# Patient Record
Sex: Male | Born: 1990 | Race: Black or African American | Hispanic: No | Marital: Married | Smoking: Never smoker
Health system: Southern US, Community
[De-identification: ages and names within clinical notes are randomized; demographics above are authoritative.]

## PROBLEM LIST (undated history)

## (undated) DIAGNOSIS — I10 Essential (primary) hypertension: Secondary | ICD-10-CM

## (undated) HISTORY — PX: TONSILLECTOMY: SUR1361

---
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
## 2012-08-03 DIAGNOSIS — M25561 Pain in right knee: Secondary | ICD-10-CM

## 2012-08-03 HISTORY — DX: Pain in right knee: M25.561

## 2012-08-04 ENCOUNTER — Other Ambulatory Visit: Payer: Self-pay | Admitting: Orthopaedic Surgery

## 2012-08-04 DIAGNOSIS — M25569 Pain in unspecified knee: Secondary | ICD-10-CM

## 2012-08-07 ENCOUNTER — Other Ambulatory Visit: Payer: Medicaid Other

## 2016-03-26 ENCOUNTER — Emergency Department (HOSPITAL_COMMUNITY): Payer: Self-pay

## 2016-03-26 ENCOUNTER — Emergency Department (HOSPITAL_COMMUNITY)
Admission: EM | Admit: 2016-03-26 | Discharge: 2016-03-26 | Disposition: A | Discharge status: Home / Self Care | Payer: Self-pay | Attending: Emergency Medicine | Admitting: Emergency Medicine

## 2016-03-26 ENCOUNTER — Encounter (HOSPITAL_COMMUNITY): Payer: Self-pay | Admitting: *Deleted

## 2016-03-26 DIAGNOSIS — S93401A Sprain of unspecified ligament of right ankle, initial encounter: Secondary | ICD-10-CM | POA: Insufficient documentation

## 2016-03-26 DIAGNOSIS — R030 Elevated blood-pressure reading, without diagnosis of hypertension: Secondary | ICD-10-CM

## 2016-03-26 DIAGNOSIS — Y999 Unspecified external cause status: Secondary | ICD-10-CM | POA: Insufficient documentation

## 2016-03-26 DIAGNOSIS — I10 Essential (primary) hypertension: Secondary | ICD-10-CM | POA: Insufficient documentation

## 2016-03-26 DIAGNOSIS — Y9301 Activity, walking, marching and hiking: Secondary | ICD-10-CM | POA: Insufficient documentation

## 2016-03-26 DIAGNOSIS — Y929 Unspecified place or not applicable: Secondary | ICD-10-CM | POA: Insufficient documentation

## 2016-03-26 DIAGNOSIS — W1840XA Slipping, tripping and stumbling without falling, unspecified, initial encounter: Secondary | ICD-10-CM | POA: Insufficient documentation

## 2016-03-26 DIAGNOSIS — IMO0001 Reserved for inherently not codable concepts without codable children: Secondary | ICD-10-CM

## 2016-03-26 MED ORDER — HYDROCODONE-ACETAMINOPHEN 5-325 MG PO TABS: 1.0000 | ORAL_TABLET | Freq: Four times a day (QID) | ORAL | Status: DC | PRN

## 2016-03-26 NOTE — Progress Notes (Signed)
Orthopedic Tech Progress Note Patient Details:  Maryclare LabradorDwayne Mayr 02-23-1991 [id number]  Ortho Devices Type of Ortho Device: Ankle Air splint, Crutches Ortho Device/Splint Location: RLE Ortho Device/Splint Interventions: Ordered, Application   Jennye MoccasinHughes, Temperence Zenor Craig 03/26/2016, 5:58 PM

## 2016-03-26 NOTE — ED Notes (Signed)
Pt c/o right ankle pain approximately 15 minutes prior to ED arrival. Pt states he was helping his mom then suddenly rolled his right ankle. Pt able to walk and bear weight on ankle.

## 2016-03-26 NOTE — ED Provider Notes (Signed)
CSN: [id number]     Arrival date & time 03/26/16  1512 History   By signing my name below, I, Freida Busman, attest that this documentation has been prepared under the direction and in the presence of non-physician practitioner, Kerrie Buffalo, NP. Electronically Signed: Freida Busman, Scribe. 03/26/2016. 5:18 PM.    Chief Complaint  Patient presents with  . Ankle Pain    Patient is a 25 y.o. male presenting with ankle pain. The history is provided by the patient. No language interpreter was used.  Ankle Pain Location:  Ankle Injury: yes   Ankle location:  R ankle Pain details:    Radiates to:  Does not radiate   Severity:  Moderate   Timing:  Constant Chronicity:  Recurrent Dislocation: no   Foreign body present:  No foreign bodies Associated symptoms: swelling   Associated symptoms: no back pain and no numbness   Risk factors: no known bone disorder      HPI Comments:  Zachary Sharp is a 25 y.o. male who presents to the Emergency Department complaining of 9/10 pain and moderate swelling of the right ankle s/p injury PTA. Pt states he tripped while walking and believes he rolled his foot. He denies head injury and LOC. No alleviating factors noted. Pt notes initial injury 2-3 months ago when he "badly" rolled the same ankle. Pt states he went to a physical therapist following that injury and notes he's intermitttently experienced occasional pain since but has been able to ambulate without difficulty.    Past Medical History  Diagnosis Date  . Hypertension    Past Surgical History  Procedure Laterality Date  . Tonsillectomy     History reviewed. No pertinent family history. Social History  Substance Use Topics  . Smoking status: Never Smoker   . Smokeless tobacco: Never Used  . Alcohol Use: No    Review of Systems  Musculoskeletal: Positive for myalgias and arthralgias. Negative for back pain.       Right ankle  Neurological: Negative for weakness.  All other systems  reviewed and are negative.   Allergies  Review of patient's allergies indicates no known allergies.  Home Medications   Prior to Admission medications   Medication Sig Start Date End Date Taking? Authorizing Provider  amLODipine (NORVASC) 10 MG tablet Take 10 mg by mouth daily.      Historical Provider, MD  HYDROcodone-acetaminophen (NORCO) 5-325 MG tablet Take 1 tablet by mouth every 6 (six) hours as needed. 03/26/16   Hope Orlene Och, NP  montelukast (SINGULAIR) 10 MG tablet Take 10 mg by mouth daily as needed. Seasonal  allergies     Historical Provider, MD   BP 141/112 mmHg  Pulse 98  Temp(Src) 97.9 F (36.6 C) (Oral)  Resp 18  Ht  (1.778 m)  Wt 122.471 kg  BMI 38.74 kg/m2  SpO2 98% Physical Exam  Constitutional: He is oriented to person, place, and time. He appears well-developed and well-nourished. No distress.  HENT:  Head: Normocephalic and atraumatic.  Eyes: Conjunctivae are normal.  Cardiovascular: Normal rate.   Pulses:      Dorsalis pedis pulses are 2+ on the right side.  2+ DP pulse with adequate circulation  Pulmonary/Chest: Effort normal.  Abdominal: He exhibits no distension.  Musculoskeletal:  Swelling noted to lateral aspect of right ankle; no pain over medial aspect  Limited ROM due to pain Plantar and dorsiflexion intact Achilles tendon intact  Neurological: He is alert and oriented to person, place,  and time.  Skin: Skin is warm and dry.  Psychiatric: He has a normal mood and affect.  Nursing note and vitals reviewed.   ED Course  Procedures   DIAGNOSTIC STUDIES:  Oxygen Saturation is 98% on RA, normal by my interpretation.    COORDINATION OF CARE:  5:10 PM Discussed treatment plan with pt at bedside and pt agreed to plan.  Labs Review Labs Reviewed - No data to display  Imaging Review Dg Ankle Complete Right  03/26/2016  CLINICAL DATA:  Ankle pain with swelling and instability after falling today. Initial encounter. EXAM: RIGHT  ANKLE - COMPLETE 3+ VIEW COMPARISON:  None. FINDINGS: The mineralization and alignment are normal. There is no evidence of acute fracture or dislocation. The tibiotalar joint spaces are maintained. There is moderate lateral soft tissue swelling. IMPRESSION: Lateral soft tissue swelling.  No evidence of acute osseous injury. Electronically Signed   By: Carey BullocksWilliam  Veazey M.D.   On: 03/26/2016 16:28    MDM   Final diagnoses:  Ankle sprain, right, initial encounter  Elevated BP     Patient X-Ray negative for obvious fracture or dislocation.  Pt advised to follow up with orthopedics. Patient given splint and crutches while in ED, conservative therapy recommended and discussed. Patient will be discharged home with Norco & is agreeable with above plan. Returns precautions discussed. Pt appears safe for discharge.  I personally performed the services described in this documentation, which was scribed in my presence. The recorded information has been reviewed and is accurate.   [address]Hope Tehach[place]M Neese, NP 03/26/16 1756  Bethann BerkshireJoseph Zammit, MD 03/26/16 [phone]

## 2016-03-26 NOTE — Discharge Instructions (Signed)
Take the motrin you have at home as needed. Do not drive if taking the narcotic as it will make you sleepy.  Ankle Sprain An ankle sprain is an injury to the strong, fibrous tissues (ligaments) that hold the bones of your ankle joint together.  CAUSES An ankle sprain is usually caused by a fall or by twisting your ankle. Ankle sprains most commonly occur when you step on the outer edge of your foot, and your ankle turns inward. People who participate in sports are more prone to these types of injuries.  SYMPTOMS   Pain in your ankle. The pain may be present at rest or only when you are trying to stand or walk.  Swelling.  Bruising. Bruising may develop immediately or within 1 to 2 days after your injury.  Difficulty standing or walking, particularly when turning corners or changing directions. DIAGNOSIS  Your caregiver will ask you details about your injury and perform a physical exam of your ankle to determine if you have an ankle sprain. During the physical exam, your caregiver will press on and apply pressure to specific areas of your foot and ankle. Your caregiver will try to move your ankle in certain ways. An X-ray exam may be done to be sure a bone was not broken or a ligament did not separate from one of the bones in your ankle (avulsion fracture).  TREATMENT  Certain types of braces can help stabilize your ankle. Your caregiver can make a recommendation for this. Your caregiver may recommend the use of medicine for pain. If your sprain is severe, your caregiver may refer you to a surgeon who helps to restore function to parts of your skeletal system (orthopedist) or a physical therapist. HOME CARE INSTRUCTIONS   Apply ice to your injury for 1-2 days or as directed by your caregiver. Applying ice helps to reduce inflammation and pain.  Put ice in a plastic bag.  Place a towel between your skin and the bag.  Leave the ice on for 15-20 minutes at a time, every 2 hours while you are  awake.  Only take over-the-counter or prescription medicines for pain, discomfort, or fever as directed by your caregiver.  Elevate your injured ankle above the level of your heart as much as possible for 2-3 days.  If your caregiver recommends crutches, use them as instructed. Gradually put weight on the affected ankle. Continue to use crutches or a cane until you can walk without feeling pain in your ankle.  If you have a plaster splint, wear the splint as directed by your caregiver. Do not rest it on anything harder than a pillow for the first 24 hours. Do not put weight on it. Do not get it wet. You may take it off to take a shower or bath.  You may have been given an elastic bandage to wear around your ankle to provide support. If the elastic bandage is too tight (you have numbness or tingling in your foot or your foot becomes cold and blue), adjust the bandage to make it comfortable.  If you have an air splint, you may blow more air into it or let air out to make it more comfortable. You may take your splint off at night and before taking a shower or bath. Wiggle your toes in the splint several times per day to decrease swelling. SEEK MEDICAL CARE IF:   You have rapidly increasing bruising or swelling.  Your toes feel extremely cold or you lose feeling  in your foot.  Your pain is not relieved with medicine. SEEK IMMEDIATE MEDICAL CARE IF:  Your toes are numb or blue.  You have severe pain that is increasing. MAKE SURE YOU:   Understand these instructions.  Will watch your condition.  Will get help right away if you are not doing well or get worse.   This information is not intended to replace advice given to you by your health care provider. Make sure you discuss any questions you have with your health care provider.   Document Released: 08/25/2005 Document Revised: 09/15/2014 Document Reviewed: 09/06/2011 Elsevier Interactive Patient Education [employer]2016 Elsevier Inc.

## 2016-03-26 NOTE — ED Notes (Signed)
Ortho at bedside.

## 2016-03-26 NOTE — ED Notes (Signed)
Patient able to ambulate independently  

## 2017-08-21 DIAGNOSIS — I10 Essential (primary) hypertension: Secondary | ICD-10-CM | POA: Diagnosis not present

## 2019-10-05 DIAGNOSIS — Z20828 Contact with and (suspected) exposure to other viral communicable diseases: Secondary | ICD-10-CM | POA: Diagnosis not present

## 2019-10-11 DIAGNOSIS — U071 COVID-19: Secondary | ICD-10-CM | POA: Diagnosis not present

## 2020-07-01 ENCOUNTER — Other Ambulatory Visit: Payer: Self-pay

## 2020-07-01 ENCOUNTER — Ambulatory Visit
Admission: RE | Admit: 2020-07-01 | Discharge: 2020-07-01 | Disposition: A | Discharge status: Home / Self Care | Payer: Medicaid Other | Source: Ambulatory Visit | Attending: Emergency Medicine | Admitting: Emergency Medicine

## 2020-07-01 VITALS — BP 178/113 | HR 107 | Temp 98.4°F | Resp 18

## 2020-07-01 DIAGNOSIS — J069 Acute upper respiratory infection, unspecified: Secondary | ICD-10-CM

## 2020-07-01 DIAGNOSIS — Z20822 Contact with and (suspected) exposure to covid-19: Secondary | ICD-10-CM

## 2020-07-01 MED ORDER — AMLODIPINE BESYLATE 10 MG PO TABS: 10.0000 mg | ORAL_TABLET | Freq: Every day | ORAL | 1 refills | Status: DC

## 2020-07-01 MED ORDER — FLUTICASONE PROPIONATE 50 MCG/ACT NA SUSP: 1.0000 | Freq: Every day | NASAL | 0 refills | Status: DC

## 2020-07-01 MED ORDER — BENZONATATE 100 MG PO CAPS: 100.0000 mg | ORAL_CAPSULE | Freq: Three times a day (TID) | ORAL | 0 refills | Status: DC

## 2020-07-01 MED ORDER — MONTELUKAST SODIUM 10 MG PO TABS: 10.0000 mg | ORAL_TABLET | Freq: Every day | ORAL | 0 refills | Status: DC | PRN

## 2020-07-01 NOTE — Discharge Instructions (Addendum)

## 2020-07-01 NOTE — ED Triage Notes (Signed)
Pt sts cough and nasal congestion x 10 days; denies fever

## 2020-07-01 NOTE — ED Provider Notes (Signed)
[facility]    CSN: [id number] Arrival date & time: 07/01/20  5462      History   Chief Complaint Chief Complaint  Patient presents with  . Appointment    1000  . Cough    HPI Zachary Sharp is a 29 y.o. male  Present for Covid testing.  Endorsing 10-day course of dry cough, nasal congestion.  No fever, chest pain or palpitations, shortness of breath or wheezing.  Does admit to asthma childhood: Does not have inhaler at home.  States he has not felt like he needed one.  States today he is doing better than he has previously.  Does have history of Covid in January 2021: Recovered without sequela.  Of note, patient's blood pressure is elevated: Has not taken amlodipine "in a while ".  Does not have PCP.  Denies headache, visual or auditory changes, dizziness, slurred speech, weakness, chest pain.  Past Medical History:  Diagnosis Date  . Hypertension     There are no problems to display for this patient.   Past Surgical History:  Procedure Laterality Date  . TONSILLECTOMY         Home Medications    Prior to Admission medications   Medication Sig Start Date End Date Taking? Authorizing Provider  amLODipine (NORVASC) 10 MG tablet Take 1 tablet (10 mg total) by mouth daily. 07/01/20   Hall-Potvin, [place], PA-C  benzonatate (TESSALON) 100 MG capsule Take 1 capsule (100 mg total) by mouth every 8 (eight) hours. 07/01/20   Hall-Potvin, [place], PA-C  fluticasone (FLONASE) 50 MCG/ACT nasal spray Place 1 spray into both nostrils daily. 07/01/20   Hall-Potvin, [place], PA-C  montelukast (SINGULAIR) 10 MG tablet Take 1 tablet (10 mg total) by mouth daily as needed. Seasonal  allergies 07/01/20   Hall-Potvin, [place], PA-C    Family History Family History  Family history unknown: Yes    Social History Social History   Tobacco Use  . Smoking status: Never Smoker  . Smokeless tobacco: Never Used  Substance Use Topics  . Alcohol use: No  . Drug use:  No     Allergies   Patient has no known allergies.   Review of Systems As per HPI   Physical Exam Triage Vital Signs ED Triage Vitals  Enc Vitals Group     BP      Pulse      Resp      Temp      Temp src      SpO2      Weight      Height      Head Circumference      Peak Flow      Pain Score      Pain Loc      Pain Edu?      Excl. in GC?    No data found.  Updated Vital Signs BP (!) 178/113 (BP Location: Right Arm)   Pulse (!) 107   Temp 98.4 F (36.9 C) (Oral)   Resp 18   SpO2 97%   Visual Acuity Right Eye Distance:   Left Eye Distance:   Bilateral Distance:    Right Eye Near:   Left Eye Near:    Bilateral Near:     Physical Exam Constitutional:      General: He is not in acute distress.    Appearance: He is not toxic-appearing or diaphoretic.  HENT:     Head: Normocephalic and atraumatic.     Mouth/Throat:  Mouth: Mucous membranes are moist.     Pharynx: Oropharynx is clear.  Eyes:     General: No scleral icterus.    Conjunctiva/sclera: Conjunctivae normal.     Pupils: Pupils are equal, round, and reactive to light.  Neck:     Comments: Trachea midline, negative JVD Cardiovascular:     Rate and Rhythm: Regular rhythm. Tachycardia present.  Pulmonary:     Effort: Pulmonary effort is normal. No respiratory distress.     Breath sounds: No wheezing.  Musculoskeletal:     Cervical back: Neck supple. No tenderness.  Lymphadenopathy:     Cervical: No cervical adenopathy.  Skin:    Capillary Refill: Capillary refill takes less than 2 seconds.     Coloration: Skin is not jaundiced or pale.     Findings: No rash.  Neurological:     Mental Status: He is alert and oriented to person, place, and time.      UC Treatments / Results  Labs (all labs ordered are listed, but only abnormal results are displayed) Labs Reviewed  NOVEL CORONAVIRUS, NAA    EKG   Radiology No results found.  Procedures Procedures (including critical care  time)  Medications Ordered in UC Medications - No data to display  Initial Impression / Assessment and Plan / UC Course  I have reviewed the triage vital signs and the nursing notes.  Pertinent labs & imaging results that were available during my care of the patient were reviewed by me and considered in my medical decision making (see chart for details).     Patient afebrile, nontoxic, with SpO2 97%.  Covid PCR pending.  Patient to quarantine until results are back.  We will treat supportively as outlined below.  Will resume amlodipine, which he has tolerated well in the past, today and follow-up with PCP for further eval/mgmt PRN.  Return precautions discussed, patient verbalized understanding and is agreeable to plan. Final Clinical Impressions(s) / UC Diagnoses   Final diagnoses:  Encounter for screening laboratory testing for COVID-19 virus  URI with cough and congestion     Discharge Instructions     Tessalon for cough. Start flonase, atrovent nasal spray for nasal congestion/drainage. You can use over the counter nasal saline rinse such as neti pot for nasal congestion. Keep hydrated, your urine should be clear to pale yellow in color. Tylenol/motrin for fever and pain. Monitor for any worsening of symptoms, chest pain, shortness of breath, wheezing, swelling of the throat, go to the emergency department for further evaluation needed.     ED Prescriptions    Medication Sig Dispense Auth. Provider   montelukast (SINGULAIR) 10 MG tablet Take 1 tablet (10 mg total) by mouth daily as needed. Seasonal  allergies 30 tablet Hall-Potvin, [place], PA-C   amLODipine (NORVASC) 10 MG tablet Take 1 tablet (10 mg total) by mouth daily. 30 tablet Hall-Potvin, [place], PA-C   benzonatate (TESSALON) 100 MG capsule Take 1 capsule (100 mg total) by mouth every 8 (eight) hours. 21 capsule Hall-Potvin, [place], PA-C   fluticasone (FLONASE) 50 MCG/ACT nasal spray Place 1 spray into both nostrils  daily. 16 g Hall-Potvin, [place], PA-C     PDMP not reviewed this encounter.   Hall-Potvin, [place], [place] 07/01/20 1036

## 2020-07-02 LAB — NOVEL CORONAVIRUS, NAA: SARS-CoV-2, NAA: NOT DETECTED

## 2020-07-02 LAB — SARS-COV-2, NAA 2 DAY TAT

## 2020-08-11 ENCOUNTER — Other Ambulatory Visit: Payer: Self-pay

## 2020-08-11 ENCOUNTER — Ambulatory Visit
Admission: RE | Admit: 2020-08-11 | Discharge: 2020-08-11 | Disposition: A | Discharge status: Home / Self Care | Payer: 59 | Source: Ambulatory Visit | Attending: Urgent Care | Admitting: Urgent Care

## 2020-08-11 VITALS — HR 103 | Temp 98.4°F | Resp 20

## 2020-08-11 DIAGNOSIS — M549 Dorsalgia, unspecified: Secondary | ICD-10-CM | POA: Diagnosis not present

## 2020-08-11 DIAGNOSIS — S46912A Strain of unspecified muscle, fascia and tendon at shoulder and upper arm level, left arm, initial encounter: Secondary | ICD-10-CM

## 2020-08-11 MED ORDER — TIZANIDINE HCL 4 MG PO TABS: 4.0000 mg | ORAL_TABLET | Freq: Every day | ORAL | 0 refills | Status: DC

## 2020-08-11 MED ORDER — NAPROXEN 500 MG PO TABS: 500.0000 mg | ORAL_TABLET | Freq: Two times a day (BID) | ORAL | 0 refills | Status: DC

## 2020-08-11 NOTE — ED Triage Notes (Signed)
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 Pt states he took and has been taking otc meds but they ae starting to not help with the pain/discomfort. Pt is aox4 and ambulatory.

## 2020-08-11 NOTE — ED Provider Notes (Signed)
[facility]   MRN: [id number] DOB: [birth year]  Subjective:   Zachary Sharp is a 29 y.o. male presenting for 65-month history of persistent left upper back pain over the scapular area.  Patient states that symptoms started after he slipped getting out of his rig/truck, grabbed onto an overhead handle and caught his body from falling.  He felt a really bad pull in the area and since has been hurting over the left upper back overlying the scapula.  He actually injured it again shortly thereafter.  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  Has not used any medications for relief.  Regarding his blood pressure, states that he forgot to take his amlodipine today.  No current facility-administered medications for this encounter.  Current Outpatient Medications:  .  amLODipine (NORVASC) 10 MG tablet, Take 1 tablet (10 mg total) by mouth daily., Disp: 30 tablet, Rfl: 1 .  benzonatate (TESSALON) 100 MG capsule, Take 1 capsule (100 mg total) by mouth every 8 (eight) hours., Disp: 21 capsule, Rfl: 0 .  fluticasone (FLONASE) 50 MCG/ACT nasal spray, Place 1 spray into both nostrils daily., Disp: 16 g, Rfl: 0 .  montelukast (SINGULAIR) 10 MG tablet, Take 1 tablet (10 mg total) by mouth daily as needed. Seasonal  allergies, Disp: 30 tablet, Rfl: 0   No Known Allergies  Past Medical History:  Diagnosis Date  . Hypertension      Past Surgical History:  Procedure Laterality Date  . TONSILLECTOMY      Family History  Family history unknown: Yes    Social History   Tobacco Use  . Smoking status: Never Smoker  . Smokeless tobacco: Never Used  Vaping Use  . Vaping Use: Never used  Substance Use Topics  . Alcohol use: No  . Drug use: No    ROS   Objective:   Vitals: Pulse (!) 103   Temp 98.4 F (36.9 C) (Oral)   Resp 20   SpO2 97%   BP 145/107 by PA-Marranda Arakelian.   Physical Exam Constitutional:      General:  He is not in acute distress.    Appearance: Normal appearance. He is well-developed. He is obese. He is not ill-appearing, toxic-appearing or diaphoretic.  HENT:     Head: Normocephalic and atraumatic.     Right Ear: External ear normal.     Left Ear: External ear normal.     Nose: Nose normal.     Mouth/Throat:     Pharynx: Oropharynx is clear.  Eyes:     General: No scleral icterus.       Right eye: No discharge.        Left eye: No discharge.     Extraocular Movements: Extraocular movements intact.     Pupils: Pupils are equal, round, and reactive to light.  Cardiovascular:     Rate and Rhythm: Normal rate.  Pulmonary:     Effort: Pulmonary effort is normal.  Musculoskeletal:     Left shoulder: No swelling, deformity, effusion, laceration, tenderness or bony tenderness. Normal range of motion. Normal strength.     Cervical back: Normal range of motion.     Thoracic back: Spasms and tenderness present. No swelling, edema, deformity, signs of trauma, lacerations or bony tenderness. Normal range of motion. No scoliosis.       Back:  Neurological:     Mental Status: He is alert and oriented to person,  place, and time.  Psychiatric:        Mood and Affect: Mood normal.        Behavior: Behavior normal.        Thought Content: Thought content normal.        Judgment: Judgment normal.      Assessment and Plan :   PDMP not reviewed this encounter.  1. Upper back pain on left side   2. Muscle strain of left scapular region, initial encounter     Counseled patient on general management of muscle strain of the left scapular region.  Recommended actually starting oral medications for this including naproxen, tizanidine.  If symptoms persist, recommended follow-up with [facility] as he is seen them before.  Also provided him with information to a different orthopedic group. Counseled patient on potential for adverse effects with medications prescribed/recommended  today, ER and return-to-clinic precautions discussed, patient verbalized understanding.    Wallis Bamberg, PA-C 08/11/20 1247

## 2020-09-26 ENCOUNTER — Ambulatory Visit
Admission: RE | Admit: 2020-09-26 | Discharge: 2020-09-26 | Disposition: A | Discharge status: Home / Self Care | Payer: 59 | Source: Ambulatory Visit | Attending: Emergency Medicine | Admitting: Emergency Medicine

## 2020-09-26 ENCOUNTER — Other Ambulatory Visit: Payer: Self-pay

## 2020-09-26 VITALS — BP 163/119 | HR 107 | Temp 98.2°F | Resp 18

## 2020-09-26 DIAGNOSIS — M7661 Achilles tendinitis, right leg: Secondary | ICD-10-CM

## 2020-09-26 MED ORDER — PREDNISONE 10 MG PO TABS: ORAL_TABLET | ORAL | 0 refills | Status: DC

## 2020-09-26 MED ORDER — NAPROXEN 500 MG PO TABS: 500.0000 mg | ORAL_TABLET | Freq: Two times a day (BID) | ORAL | 0 refills | Status: DC

## 2020-09-26 NOTE — ED Triage Notes (Signed)
Pt sts right ankle pain x 2 weeks; sts hx of injury to same ankle many years ago but denies new injury

## 2020-09-26 NOTE — Discharge Instructions (Signed)
Begin prednisone course over the next 6 days-take with food After completing prednisone may use Naprosyn twice daily as needed for pain May wear ankle brace for support and comfort Ice and elevate when resting Gentle stretching-see attached Follow-up if not improving or worsening

## 2020-09-26 NOTE — ED Provider Notes (Signed)
[facility]    CSN: [id number] Arrival date & time: 09/26/20  1557      History   Chief Complaint Chief Complaint  Patient presents with  . Appointment    1600  . Ankle Pain    HPI Zachary [facility] is a 30 y.o. male history of hypertension presenting today for evaluation of right ankle pain.  Reports over the past 2 to 3 weeks he has had pain in the back portion of his ankle, believes it in his Achilles.  Denies any injury or trauma recently.  Denies any increase in activity.  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  HPI  Past Medical History:  Diagnosis Date  . Hypertension     There are no problems to display for this patient.   Past Surgical History:  Procedure Laterality Date  . TONSILLECTOMY         Home Medications    Prior to Admission medications   Medication Sig Start Date End Date Taking? Authorizing Provider  naproxen (NAPROSYN) 500 MG tablet Take 1 tablet (500 mg total) by mouth 2 (two) times daily. 09/26/20  Yes James Lafalce C, PA-C  predniSONE (DELTASONE) 10 MG tablet Begin with 6 tabs on day 1, 5 tab on day 2, 4 tab on day 3, 3 tab on day 4, 2 tab on day 5, 1 tab on day 6-take with food 09/26/20  Yes Jessabelle Markiewicz C, PA-C  amLODipine (NORVASC) 10 MG tablet Take 1 tablet (10 mg total) by mouth daily. 07/01/20   Hall-Potvin, [place], PA-C  fluticasone (FLONASE) 50 MCG/ACT nasal spray Place 1 spray into both nostrils daily. 07/01/20   Hall-Potvin, [place], PA-C  montelukast (SINGULAIR) 10 MG tablet Take 1 tablet (10 mg total) by mouth daily as needed. Seasonal  allergies 07/01/20   Hall-Potvin, [place], PA-C    Family History Family History  Family history unknown: Yes    Social History Social History   Tobacco Use  . Smoking status: Never Smoker  . Smokeless tobacco: Never Used  Vaping Use  . Vaping Use: Never used  Substance Use Topics   . Alcohol use: No  . Drug use: No     Allergies   Patient has no known allergies.   Review of Systems Review of Systems  Constitutional: Negative for fatigue and fever.  Eyes: Negative for redness, itching and visual disturbance.  Respiratory: Negative for shortness of breath.   Cardiovascular: Negative for chest pain and leg swelling.  Gastrointestinal: Negative for nausea and vomiting.  Musculoskeletal: Positive for arthralgias and gait problem. Negative for myalgias.  Skin: Negative for color change, rash and wound.  Neurological: Negative for dizziness, syncope, weakness, light-headedness and headaches.     Physical Exam Triage Vital Signs ED Triage Vitals  Enc Vitals Group     BP 09/26/20 1607 (!) 160/121     Pulse Rate 09/26/20 1607 (!) 107     Resp 09/26/20 1607 18     Temp 09/26/20 1607 98.2 F (36.8 C)     Temp Source 09/26/20 1607 Oral     SpO2 09/26/20 1607 96 %     Weight --      Height --      Head Circumference --      Peak Flow --      Pain Score 09/26/20 1625 4     Pain Loc --  Pain Edu? --      Excl. in GC? --    No data found.  Updated Vital Signs BP (!) 163/119   Pulse (!) 107   Temp 98.2 F (36.8 C) (Oral)   Resp 18   SpO2 96%   Visual Acuity Right Eye Distance:   Left Eye Distance:   Bilateral Distance:    Right Eye Near:   Left Eye Near:    Bilateral Near:     Physical Exam Vitals and nursing note reviewed.  Constitutional:      Appearance: He is well-developed and well-nourished.     Comments: No acute distress  HENT:     Head: Normocephalic and atraumatic.     Nose: Nose normal.  Eyes:     Conjunctiva/sclera: Conjunctivae normal.  Cardiovascular:     Rate and Rhythm: Normal rate.  Pulmonary:     Effort: Pulmonary effort is normal. No respiratory distress.  Abdominal:     General: There is no distension.  Musculoskeletal:        General: Normal range of motion.     Cervical back: Neck supple.     Comments:  Right foot/ankle: No obvious swelling deformity or discoloration, nontender to palpation of medial lateral malleolus or throughout dorsum of foot, tender to palpation along distal Achilles and into heel, no plantar tenderness, dorsalis pedis 2+  Skin:    General: Skin is warm and dry.  Neurological:     Mental Status: He is alert and oriented to person, place, and time.  Psychiatric:        Mood and Affect: Mood and affect normal.      UC Treatments / Results  Labs (all labs ordered are listed, but only abnormal results are displayed) Labs Reviewed - No data to display  EKG   Radiology No results found.  Procedures Procedures (including critical care time)  Medications Ordered in UC Medications - No data to display  Initial Impression / Assessment and Plan / UC Course  I have reviewed the triage vital signs and the nursing notes.  Pertinent labs & imaging results that were available during my care of the patient were reviewed by me and considered in my medical decision making (see chart for details).     Achilles tendinitis-no mechanism of injury, negative Thompson's, initiating on 6-day prednisone taper followed by NSAIDs.  Ice elevation and gentle stretching. Discussed strict return precautions. Patient verbalized understanding and is agreeable with plan.   Final Clinical Impressions(s) / UC Diagnoses   Final diagnoses:  Achilles tendinitis of right lower extremity     Discharge Instructions     Begin prednisone course over the next 6 days-take with food After completing prednisone may use Naprosyn twice daily as needed for pain May wear ankle brace for support and comfort Ice and elevate when resting Gentle stretching-see attached Follow-up if not improving or worsening    ED Prescriptions    Medication Sig Dispense Auth. Provider   predniSONE (DELTASONE) 10 MG tablet Begin with 6 tabs on day 1, 5 tab on day 2, 4 tab on day 3, 3 tab on day 4, 2 tab on day  5, 1 tab on day 6-take with food 21 tablet Keylin Podolsky C, PA-C   naproxen (NAPROSYN) 500 MG tablet Take 1 tablet (500 mg total) by mouth 2 (two) times daily. 30 tablet Aalijah Lanphere, [place] C, PA-C     PDMP not reviewed this encounter.   Lew Dawes, [place] 09/26/20 1751

## 2021-04-02 ENCOUNTER — Other Ambulatory Visit: Payer: Self-pay

## 2021-04-02 ENCOUNTER — Ambulatory Visit
Admission: EM | Admit: 2021-04-02 | Discharge: 2021-04-02 | Disposition: A | Discharge status: Home / Self Care | Payer: 59 | Attending: Student | Admitting: Student

## 2021-04-02 DIAGNOSIS — R112 Nausea with vomiting, unspecified: Secondary | ICD-10-CM

## 2021-04-02 DIAGNOSIS — R197 Diarrhea, unspecified: Secondary | ICD-10-CM

## 2021-04-02 DIAGNOSIS — K219 Gastro-esophageal reflux disease without esophagitis: Secondary | ICD-10-CM

## 2021-04-02 DIAGNOSIS — E861 Hypovolemia: Secondary | ICD-10-CM

## 2021-04-02 MED ORDER — OMEPRAZOLE 20 MG PO CPDR: 20.0000 mg | DELAYED_RELEASE_CAPSULE | Freq: Every day | ORAL | 0 refills | Status: DC

## 2021-04-02 MED ORDER — ONDANSETRON 8 MG PO TBDP: 8.0000 mg | ORAL_TABLET | Freq: Three times a day (TID) | ORAL | 0 refills | Status: DC | PRN

## 2021-04-02 MED ORDER — ALUM & MAG HYDROXIDE-SIMETH 200-200-20 MG/5ML PO SUSP
30.0000 mL | Freq: Once | ORAL | Status: AC
  Administered 2021-04-02: 30 mL via ORAL

## 2021-04-02 NOTE — Discharge Instructions (Addendum)
-  Prilosec (omeprazole) twice daily while symptoms persist -Take the Zofran (ondansetron) up to 3 times daily for nausea and vomiting. Dissolve one pill under your tongue or between your teeth and your cheek. -You can continue pepcid -You're a little dehydrated. Make sure to drink lots of fluids- water, gatorade, etc.  -Seek additional medical attention if symptoms worse/persist (you're not able to keep fluids down despite treatment, you develop worsening abdominal pain, you become weak or dizzy, you start vomiting up blood, etc.)

## 2021-04-02 NOTE — ED Provider Notes (Signed)
[facility]    CSN: [id number] Arrival date & time: 04/02/21  1749      History   Chief Complaint Chief Complaint  Patient presents with   Emesis   Nausea    HPI Zachary [facility] is a 30 y.o. male presenting with GI symptoms. Medical history untreated GERD and hypertension.  Endorses 5 days of nausea with vomiting, watery diarrhea, generalized abdominal cramping and "gurgling", and some epigastric burning.  3 episodes of bilious vomiting and about 3 episodes of watery diarrhea today.  States the epigastric burning is actually minimal, but he is belching acid.  Pepcid is providing little relief.  He is tolerating some bland foods, but has not tolerated more exciting foods like sausage.  States that other family members also had the same symptoms, as they were traveling in [place] and eating rich foods, but his other family members are now feeling better.  Denies weakness, dizziness, left-sided chest pain, shortness of breath, dizziness, fevers/chills, cough. Negative home covid test.     HPI  Past Medical History:  Diagnosis Date   Hypertension     There are no problems to display for this patient.   Past Surgical History:  Procedure Laterality Date   TONSILLECTOMY         Home Medications    Prior to Admission medications   Medication Sig Start Date End Date Taking? Authorizing Provider  omeprazole (PRILOSEC) 20 MG capsule Take 1 capsule (20 mg total) by mouth daily. With breakfast and dinner while symptoms persist 04/02/21  Yes Rhys Martini, PA-C  ondansetron (ZOFRAN ODT) 8 MG disintegrating tablet Take 1 tablet (8 mg total) by mouth every 8 (eight) hours as needed for nausea or vomiting. 04/02/21  Yes Rhys Martini, PA-C  amLODipine (NORVASC) 10 MG tablet Take 1 tablet (10 mg total) by mouth daily. 07/01/20   Hall-Potvin, [place], PA-C  fluticasone (FLONASE) 50 MCG/ACT nasal spray Place 1 spray into both nostrils daily. 07/01/20   Hall-Potvin,  [place], PA-C  montelukast (SINGULAIR) 10 MG tablet Take 1 tablet (10 mg total) by mouth daily as needed. Seasonal  allergies 07/01/20   Hall-Potvin, [place], PA-C  naproxen (NAPROSYN) 500 MG tablet Take 1 tablet (500 mg total) by mouth 2 (two) times daily. 09/26/20   Wieters, Hallie C, PA-C  predniSONE (DELTASONE) 10 MG tablet Begin with 6 tabs on day 1, 5 tab on day 2, 4 tab on day 3, 3 tab on day 4, 2 tab on day 5, 1 tab on day 6-take with food 09/26/20   Wieters, Junius Creamer, PA-C    Family History Family History  Family history unknown: Yes    Social History Social History   Tobacco Use   Smoking status: Never   Smokeless tobacco: Never  Vaping Use   Vaping Use: Never used  Substance Use Topics   Alcohol use: No   Drug use: No     Allergies   Patient has no known allergies.   Review of Systems Review of Systems  Constitutional:  Negative for appetite change, chills, diaphoresis, fever and unexpected weight change.  HENT:  Negative for congestion, ear pain, sinus pressure, sinus pain, sneezing, sore throat and trouble swallowing.   Respiratory:  Negative for cough, chest tightness and shortness of breath.   Cardiovascular:  Negative for chest pain.  Gastrointestinal:  Positive for abdominal pain, nausea and vomiting. Negative for abdominal distention, anal bleeding, blood in stool, constipation, diarrhea and rectal pain.  Genitourinary:  Negative  for dysuria, flank pain, frequency and urgency.  Musculoskeletal:  Negative for back pain and myalgias.  Neurological:  Negative for dizziness, light-headedness and headaches.  All other systems reviewed and are negative.   Physical Exam Triage Vital Signs ED Triage Vitals  Enc Vitals Group     BP      Pulse      Resp      Temp      Temp src      SpO2      Weight      Height      Head Circumference      Peak Flow      Pain Score      Pain Loc      Pain Edu?      Excl. in GC?    No data found.  Updated Vital  Signs BP (!) 160/112 (BP Location: Left Arm)   Pulse (!) 120   Temp 98.6 F (37 C) (Oral)   Resp 18   SpO2 94%   Visual Acuity Right Eye Distance:   Left Eye Distance:   Bilateral Distance:    Right Eye Near:   Left Eye Near:    Bilateral Near:     Physical Exam Vitals reviewed.  Constitutional:      General: He is not in acute distress.    Appearance: Normal appearance. He is obese. He is not ill-appearing.  HENT:     Head: Normocephalic and atraumatic.     Mouth/Throat:     Mouth: Mucous membranes are moist.     Comments: Dry mucous membranes Eyes:     Extraocular Movements: Extraocular movements intact.     Pupils: Pupils are equal, round, and reactive to light.  Cardiovascular:     Rate and Rhythm: Regular rhythm. Tachycardia present.     Heart sounds: Normal heart sounds.  Pulmonary:     Effort: Pulmonary effort is normal.     Breath sounds: Normal breath sounds. No wheezing, rhonchi or rales.  Abdominal:     General: Bowel sounds are normal. There is no distension.     Palpations: Abdomen is soft. There is no mass.     Tenderness: There is no abdominal tenderness. There is no right CVA tenderness, left CVA tenderness, guarding or rebound. Negative signs include Murphy's sign, Rovsing's sign and McBurney's sign.     Comments: Exam limited due to body habitus Increased BS throughout Epigastric tenderness  Skin:    General: Skin is warm.     Capillary Refill: Capillary refill takes 2 to 3 seconds.     Comments: Cap refill 2-3 seconds  Neurological:     General: No focal deficit present.     Mental Status: He is alert and oriented to person, place, and time.  Psychiatric:        Mood and Affect: Mood normal.        Behavior: Behavior normal.     UC Treatments / Results  Labs (all labs ordered are listed, but only abnormal results are displayed) Labs Reviewed - No data to display  EKG   Radiology No results found.  Procedures Procedures (including  critical care time)  Medications Ordered in UC Medications  alum & mag hydroxide-simeth (MAALOX/MYLANTA) 200-200-20 MG/5ML suspension 30 mL (has no administration in time range)    Initial Impression / Assessment and Plan / UC Course  I have reviewed the triage vital signs and the nursing notes.  Pertinent labs & imaging results  that were available during my care of the patient were reviewed by me and considered in my medical decision making (see chart for details).     This patient is a very pleasant 30 y.o. year old male presenting with GERD and nausea with vomiting x5 days. This patient is tachycardic and hypertensive. Dry mucous membranes.   Long history GERD that is typically well controlled on no medications. Suspect exacerbation related to travel/eating out. GI cocktail administered today with some improvement. Continue pepcid; also sent prilosec and zofran. Good hydration. ED return precautions discussed. Patient verbalizes understanding and agreement.   Negative home covid test.   Coding this visit a Level 4 given acute illness with systemic symptoms and prescription drug management.   Final Clinical Impressions(s) / UC Diagnoses   Final diagnoses:  Gastroesophageal reflux disease without esophagitis  Non-intractable vomiting with nausea, unspecified vomiting type  Diarrhea, unspecified type  Hypovolemia     Discharge Instructions      -Prilosec (omeprazole) twice daily while symptoms persist -Take the Zofran (ondansetron) up to 3 times daily for nausea and vomiting. Dissolve one pill under your tongue or between your teeth and your cheek. -You can continue pepcid -You're a little dehydrated. Make sure to drink lots of fluids- water, gatorade, etc.  -Seek additional medical attention if symptoms worse/persist (you're not able to keep fluids down despite treatment, you develop worsening abdominal pain, you become weak or dizzy, you start vomiting up blood,  etc.)     ED Prescriptions     Medication Sig Dispense Auth. Provider   ondansetron (ZOFRAN ODT) 8 MG disintegrating tablet Take 1 tablet (8 mg total) by mouth every 8 (eight) hours as needed for nausea or vomiting. 20 tablet Rhys Martini, PA-C   omeprazole (PRILOSEC) 20 MG capsule Take 1 capsule (20 mg total) by mouth daily. With breakfast and dinner while symptoms persist 30 capsule Rhys Martini, PA-C      PDMP not reviewed this encounter.   Rhys Martini, PA-C 04/02/21 1839

## 2021-04-02 NOTE — ED Triage Notes (Signed)
5 day h/o emesis, nausea diarrhea and two days of abdominal cramping and intermittent GERD.

## 2021-06-17 NOTE — Progress Notes (Signed)
Erroneous encounter

## 2021-06-18 ENCOUNTER — Encounter: Payer: 59 | Admitting: Family

## 2021-06-18 DIAGNOSIS — Z7689 Persons encountering health services in other specified circumstances: Secondary | ICD-10-CM

## 2021-06-19 ENCOUNTER — Ambulatory Visit
Admission: EM | Admit: 2021-06-19 | Discharge: 2021-06-19 | Disposition: A | Discharge status: Home / Self Care | Payer: 59 | Attending: Internal Medicine | Admitting: Internal Medicine

## 2021-06-19 ENCOUNTER — Encounter: Payer: Self-pay | Admitting: Emergency Medicine

## 2021-06-19 ENCOUNTER — Other Ambulatory Visit: Payer: Self-pay

## 2021-06-19 DIAGNOSIS — R030 Elevated blood-pressure reading, without diagnosis of hypertension: Secondary | ICD-10-CM | POA: Diagnosis not present

## 2021-06-19 DIAGNOSIS — H65193 Other acute nonsuppurative otitis media, bilateral: Secondary | ICD-10-CM

## 2021-06-19 MED ORDER — AMOXICILLIN 875 MG PO TABS: 875.0000 mg | ORAL_TABLET | Freq: Two times a day (BID) | ORAL | 0 refills | Status: AC

## 2021-06-19 NOTE — Discharge Instructions (Addendum)
You have been prescribed amoxicillin antibiotic to treat bilateral ear infection.  Please follow-up with primary care for further evaluation and management of blood pressure.

## 2021-06-19 NOTE — ED Triage Notes (Signed)
Patient c/o left ear pain x 3-4 days, lymph nodes on left side swollen as well.  Patient denies any OTC meds.  Patient is vaccinated for COVID.

## 2021-06-19 NOTE — ED Provider Notes (Signed)
[facility]    CSN: [id number] Arrival date & time: 06/19/21  1356      History   Chief Complaint Chief Complaint  Patient presents with   Otalgia    HPI Zachary Sharp is a 30 y.o. male.   Patient presents with left hip pain that has been present for approximately 3 days.  Patient denies any recent or current upper respiratory symptoms, sore throat, fever.  Denies any known sick contacts.  Denies any trauma to the ear.  Denies any drainage from the ear.  Denies any decreased hearing.  Patient also has elevated blood pressure reading in urgent [facility] today.  Patient reports that he takes blood pressure medication and was supposed to have a follow-up with PCP yesterday, but he missed his appointment.  Patient has rescheduled appointment.  Patient denies any chest pain, shortness of breath, headache, blurred vision, dizziness, nausea, vomiting.   Otalgia  Past Medical History:  Diagnosis Date   Hypertension     There are no problems to display for this patient.   Past Surgical History:  Procedure Laterality Date   TONSILLECTOMY         Home Medications    Prior to Admission medications   Medication Sig Start Date End Date Taking? Authorizing Provider  amLODipine (NORVASC) 10 MG tablet Take 1 tablet (10 mg total) by mouth daily. 07/01/20  Yes Hall-Potvin, [place], PA-C  amoxicillin (AMOXIL) 875 MG tablet Take 1 tablet (875 mg total) by mouth 2 (two) times daily for 10 days. 06/19/21 06/29/21 Yes Lance Muss, FNP  fluticasone (FLONASE) 50 MCG/ACT nasal spray Place 1 spray into both nostrils daily. 07/01/20   Hall-Potvin, [place], PA-C  montelukast (SINGULAIR) 10 MG tablet Take 1 tablet (10 mg total) by mouth daily as needed. Seasonal  allergies 07/01/20   Hall-Potvin, [place], PA-C  naproxen (NAPROSYN) 500 MG tablet Take 1 tablet (500 mg total) by mouth 2 (two) times daily. 09/26/20   Wieters, Hallie C, PA-C  omeprazole (PRILOSEC) 20 MG capsule Take 1  capsule (20 mg total) by mouth daily. With breakfast and dinner while symptoms persist 04/02/21   Rhys Martini, PA-C  ondansetron (ZOFRAN ODT) 8 MG disintegrating tablet Take 1 tablet (8 mg total) by mouth every 8 (eight) hours as needed for nausea or vomiting. 04/02/21   Rhys Martini, PA-C  predniSONE (DELTASONE) 10 MG tablet Begin with 6 tabs on day 1, 5 tab on day 2, 4 tab on day 3, 3 tab on day 4, 2 tab on day 5, 1 tab on day 6-take with food 09/26/20   Wieters, Junius Creamer, PA-C    Family History Family History  Family history unknown: Yes    Social History Social History   Tobacco Use   Smoking status: Never   Smokeless tobacco: Never  Vaping Use   Vaping Use: Never used  Substance Use Topics   Alcohol use: No   Drug use: No     Allergies   Patient has no known allergies.   Review of Systems Review of Systems Per HPI  Physical Exam Triage Vital Signs ED Triage Vitals  Enc Vitals Group     BP 06/19/21 1430 (!) 168/122     Pulse Rate 06/19/21 1430 98     Resp 06/19/21 1430 18     Temp 06/19/21 1430 98.7 F (37.1 C)     Temp Source 06/19/21 1430 Oral     SpO2 06/19/21 1430 97 %  Weight 06/19/21 1431 (!) 360 lb (163.3 kg)     Height 06/19/21 1431 5\' 10"  (1.778 m)     Head Circumference --      Peak Flow --      Pain Score 06/19/21 1431 6     Pain Loc --      Pain Edu? --      Excl. in GC? --    No data found.  Updated Vital Signs BP (!) 159/128 (BP Location: Left Arm)   Pulse 98   Temp 98.7 F (37.1 C) (Oral)   Resp 18   Ht 5\' 10"  (1.778 m)   Wt (!) 360 lb (163.3 kg)   SpO2 97%   BMI 51.65 kg/m   Visual Acuity Right Eye Distance:   Left Eye Distance:   Bilateral Distance:    Right Eye Near:   Left Eye Near:    Bilateral Near:     Physical Exam Constitutional:      General: He is not in acute distress.    Appearance: Normal appearance. He is not toxic-appearing or diaphoretic.  HENT:     Head: Normocephalic and atraumatic.     Right  Ear: Ear canal normal. No decreased hearing noted. No drainage, swelling or tenderness. No middle ear effusion. No mastoid tenderness. Tympanic membrane is erythematous. Tympanic membrane is not perforated or bulging.     Left Ear: Ear canal normal. No drainage, swelling or tenderness.  No middle ear effusion. No mastoid tenderness. Tympanic membrane is erythematous. Tympanic membrane is not perforated or bulging.     Nose: Nose normal.     Mouth/Throat:     Lips: Pink.     Mouth: Mucous membranes are moist.     Pharynx: Oropharynx is clear.  Eyes:     Extraocular Movements: Extraocular movements intact.     Conjunctiva/sclera: Conjunctivae normal.  Cardiovascular:     Rate and Rhythm: Normal rate and regular rhythm.     Pulses: Normal pulses.     Heart sounds: Normal heart sounds.  Pulmonary:     Effort: Pulmonary effort is normal. No respiratory distress.     Breath sounds: Normal breath sounds.  Lymphadenopathy:     Cervical: No cervical adenopathy.  Skin:    General: Skin is warm and dry.  Neurological:     General: No focal deficit present.     Mental Status: He is alert and oriented to person, place, and time. Mental status is at baseline.  Psychiatric:        Mood and Affect: Mood normal.        Behavior: Behavior normal.        Thought Content: Thought content normal.        Judgment: Judgment normal.     UC Treatments / Results  Labs (all labs ordered are listed, but only abnormal results are displayed) Labs Reviewed - No data to display  EKG   Radiology No results found.  Procedures Procedures (including critical [facility] time)  Medications Ordered in UC Medications - No data to display  Initial Impression / Assessment and Plan / UC Course  I have reviewed the triage vital signs and the nursing notes.  Pertinent labs & imaging results that were available during my [facility] of the patient were reviewed by me and considered in my medical decision making (see chart  for details).     Will treat bilateral otitis media with amoxicillin x10 days.  Patient advised to follow-up with PCP  for further evaluation and management of blood pressure and to monitor blood pressure at home.  No signs of hypertensive urgency on physical exam at this time.  No red flags seen on exam.Discussed strict return precautions. Patient verbalized understanding and is agreeable with plan.  Final Clinical Impressions(s) / UC Diagnoses   Final diagnoses:  Other non-recurrent acute nonsuppurative otitis media of both ears  Elevated blood pressure reading     Discharge Instructions      You have been prescribed amoxicillin antibiotic to treat bilateral ear infection.  Please follow-up with primary [facility] for further evaluation and management of blood pressure.     ED Prescriptions     Medication Sig Dispense Auth. Provider   amoxicillin (AMOXIL) 875 MG tablet Take 1 tablet (875 mg total) by mouth 2 (two) times daily for 10 days. 20 tablet Lance Muss, FNP      PDMP not reviewed this encounter.   Lance Muss, FNP 06/19/21 1454

## 2021-07-31 NOTE — Progress Notes (Signed)
Subjective:    Zachary Sharp - 30 y.o. male MRN [id number]  Date of birth: [birth year]  HPI  Zachary Sharp is to establish care.   Current issues and/or concerns: HYPERTENSION: Currently taking: see medication list Med Adherence: Reports has not taken Amlodipine for at least 6 months, doesn't feel it is working as it should. Reports taking Amlodipine since in high school without improvement of blood pressure. Was followed by Cardiology in the past and continued on the same Amlodipine. In the past has not had any other blood pressure medications besides Amlodipine.  Adherence with salt restriction (low-salt diet): []  Yes   [x]  No Exercise: Yes []  No [x]  Home Monitoring?: []  Yes    [x]  No Monitoring Frequency: []  Yes    [x]  No Home BP results range: []  Yes    [x]  No Smoking []  Yes [x]  No SOB/Chest Pain []  Yes    [x]  No  2. URGENT CARE FOLLOW-UP: 06/19/2021 [facility] per NP note: Will treat bilateral otitis media with amoxicillin x10 days.  Patient advised to follow-up with PCP for further evaluation and management of blood pressure and to monitor blood pressure at home.  No signs of hypertensive urgency on physical exam at this time.  No red flags seen on exam.Discussed strict return precautions. Patient verbalized understanding and is agreeable with plan.   08/06/2021: Finished Amoxicillin and ears feeling back to normal.    ROS per HPI    Health Maintenance:  Health Maintenance Due  Topic Date Due   COVID-19 Vaccine (1) Never done   Pneumococcal Vaccine 75-60 Years old (1 - PCV) Never done   HIV Screening  Never done   Hepatitis C Screening  Never done   TETANUS/TDAP  10/01/2020     Past Medical History: Patient Active Problem List   Diagnosis Date Noted   Allergic rhinitis 08/06/2021   Asthma 08/06/2021   Essential (primary) hypertension 08/06/2021   History of chlamydia 08/06/2021   Keloid scar 08/06/2021   Morbid obesity (HCC)  08/06/2021   Knee pain, right 08/03/2012     Social History   reports that he has never smoked. He has never used smokeless tobacco. He reports that he does not drink alcohol and does not use drugs.   Family History  Family history is unknown by patient.   Medications: reviewed and updated   Objective:   Physical Exam BP (!) 168/88 (BP Location: Left Arm, Patient Position: Sitting, Cuff Size: Large)   Pulse (!) 111   Temp 98.3 F (36.8 C)   Resp 18   Ht 5' 8.54" (1.741 m)   Wt (!) 385 lb (174.6 kg)   SpO2 97%   BMI 57.62 kg/m   Physical Exam HENT:     Head: Normocephalic and atraumatic.  Eyes:     Extraocular Movements: Extraocular movements intact.     Conjunctiva/sclera: Conjunctivae normal.     Pupils: Pupils are equal, round, and reactive to light.  Cardiovascular:     Rate and Rhythm: Tachycardia present.     Pulses: Normal pulses.     Heart sounds: Normal heart sounds.  Pulmonary:     Effort: Pulmonary effort is normal.     Breath sounds: Normal breath sounds.  Musculoskeletal:     Cervical back: Normal range of motion and neck supple.  Neurological:     General: No focal deficit present.     Mental Status: He is alert and oriented to person, place, and  time.  Psychiatric:        Mood and Affect: Mood normal.        Behavior: Behavior normal.      Assessment & Plan:  1. Encounter to establish care: - Patient presents today to establish care.  - Return for annual physical examination, labs, and health maintenance. Arrive fasting meaning having no food for at least 8 hours prior to appointment. You may have only water or black coffee. Please take scheduled medications as normal.  2. Essential (primary) hypertension: - Blood pressure not at goal during today's visit. Patient asymptomatic without chest pressure, chest pain, palpitations, shortness of breath, worst headache of life, and any additional red flag symptoms. - Begin Valsartan-Hydrochlorothiazide  as prescribed. - Counseled on blood pressure goal of less than 130/80, low-sodium, DASH diet, medication compliance, 150 minutes of moderate intensity exercise per week as tolerated. Discussed medication compliance, adverse effects. - BMP to evaluate kidney function and electrolyte balance. - Follow-up with primary provider in 2 weeks or sooner if needed.  - Basic Metabolic Panel - valsartan-hydrochlorothiazide (DIOVAN-HCT) 160-12.5 MG tablet; Take 1 tablet by mouth daily.  Dispense: 30 tablet; Refill: 0  3. Other non-recurrent acute nonsuppurative otitis media of both ears: - Resolved.     Patient was given clear instructions to go to Emergency Department or return to medical center if symptoms don't improve, worsen, or new problems develop.The patient verbalized understanding.  I discussed the assessment and treatment plan with the patient. The patient was provided an opportunity to ask questions and all were answered. The patient agreed with the plan and demonstrated an understanding of the instructions.   The patient was advised to call back or seek an in-person evaluation if the symptoms worsen or if the condition fails to improve as anticipated.    Ricky Stabs, NP 08/06/2021, 9:37 AM Primary Care at [facility]

## 2021-08-06 ENCOUNTER — Encounter: Payer: Self-pay | Admitting: Family

## 2021-08-06 ENCOUNTER — Other Ambulatory Visit: Payer: Self-pay

## 2021-08-06 ENCOUNTER — Ambulatory Visit (INDEPENDENT_AMBULATORY_CARE_PROVIDER_SITE_OTHER): Payer: 59 | Admitting: Family

## 2021-08-06 VITALS — BP 168/88 | HR 111 | Temp 98.3°F | Resp 18 | Ht 68.54 in | Wt 385.0 lb

## 2021-08-06 DIAGNOSIS — H65193 Other acute nonsuppurative otitis media, bilateral: Secondary | ICD-10-CM | POA: Diagnosis not present

## 2021-08-06 DIAGNOSIS — Z8619 Personal history of other infectious and parasitic diseases: Secondary | ICD-10-CM | POA: Insufficient documentation

## 2021-08-06 DIAGNOSIS — J45909 Unspecified asthma, uncomplicated: Secondary | ICD-10-CM | POA: Insufficient documentation

## 2021-08-06 DIAGNOSIS — J309 Allergic rhinitis, unspecified: Secondary | ICD-10-CM

## 2021-08-06 DIAGNOSIS — Z7689 Persons encountering health services in other specified circumstances: Secondary | ICD-10-CM

## 2021-08-06 DIAGNOSIS — I10 Essential (primary) hypertension: Secondary | ICD-10-CM

## 2021-08-06 DIAGNOSIS — L910 Hypertrophic scar: Secondary | ICD-10-CM

## 2021-08-06 HISTORY — DX: Allergic rhinitis, unspecified: J30.9

## 2021-08-06 HISTORY — DX: Hypertrophic scar: L91.0

## 2021-08-06 HISTORY — DX: Morbid (severe) obesity due to excess calories: E66.01

## 2021-08-06 HISTORY — DX: Unspecified asthma, uncomplicated: J45.909

## 2021-08-06 MED ORDER — VALSARTAN-HYDROCHLOROTHIAZIDE 160-12.5 MG PO TABS: 1.0000 | ORAL_TABLET | Freq: Every day | ORAL | 0 refills | Status: DC

## 2021-08-06 MED ORDER — VALSARTAN-HYDROCHLOROTHIAZIDE 160-25 MG PO TABS: 1.0000 | ORAL_TABLET | Freq: Every day | ORAL | 0 refills | Status: DC

## 2021-08-06 NOTE — Progress Notes (Signed)
Pt presents to establish care, pt states that he does not take the Amlodipine cause he feels its not working stopped taking about 6 months ago

## 2021-08-06 NOTE — Patient Instructions (Signed)
Thank you for choosing Primary Care at [facility] for your medical home!    Zachary Sharp was seen by Rema Fendt, NP today.   Zachary Sharp's primary care provider is Ricky Stabs, NP.   For the best care possible,  you should try to see Ricky Stabs, NP whenever you come to clinic.   We look forward to seeing you again soon!  If you have any questions about your visit today,  please call us at [phone]  Or feel free to reach your provider via MyChart.    Keeping you healthy   Get these tests Blood pressure- Have your blood pressure checked once a year by your healthcare provider.  Normal blood pressure is 120/80. Weight- Have your body mass index (BMI) calculated to screen for obesity.  BMI is a measure of body fat based on height and weight. You can also calculate your own BMI at https://www.west-esparza.com/. Cholesterol- Have your cholesterol checked regularly starting at age 87, sooner may be necessary if you have diabetes, high blood pressure, if a family member developed heart diseases at an early age or if you smoke.  Chlamydia, HIV, and other sexual transmitted disease- Get screened each year until the age of 28 then within three months of each new sexual partner. Diabetes- Have your blood sugar checked regularly if you have high blood pressure, high cholesterol, a family history of diabetes or if you are overweight.   Get these vaccines Flu shot- Every fall. Tetanus shot- Every 10 years. Menactra- Single dose; prevents meningitis.   Take these steps Don't smoke- If you do smoke, ask your healthcare provider about quitting. For tips on how to quit, go to www.smokefree.gov or call 1-800-QUIT-NOW. Be physically active- Exercise 5 days a week for at least 30 minutes.  If you are not already physically active start slow and gradually work up to 30 minutes of moderate physical activity.  Examples of moderate activity include walking briskly, mowing the yard, dancing,  swimming bicycling, etc. Eat a healthy diet- Eat a variety of healthy foods such as fruits, vegetables, low fat milk, low fat cheese, yogurt, lean meats, poultry, fish, beans, tofu, etc.  For more information on healthy eating, go to www.thenutritionsource.org Drink alcohol in moderation- Limit alcohol intake two drinks or less a day.  Never drink and drive. Dentist- Brush and floss teeth twice daily; visit your dentis twice a year. Depression-Your emotional health is as important as your physical health.  If you're feeling down, losing interest in things you normally enjoy please talk with your healthcare provider. Gun Safety- If you keep a gun in your home, keep it unloaded and with the safety lock on.  Bullets should be stored separately. Helmet use- Always wear a helmet when riding a motorcycle, bicycle, rollerblading or skateboarding. Safe sex- If you may be exposed to a sexually transmitted infection, use a condom Seat belts- Seat bels can save your life; always wear one. Smoke/Carbon Monoxide detectors- These detectors need to be installed on the appropriate level of your home.  Replace batteries at least once a year. Skin Cancer- When out in the sun, cover up and use sunscreen SPF 15 or higher. Violence- If anyone is threatening or hurting you, please tell your healthcare provider.

## 2021-08-07 ENCOUNTER — Encounter: Payer: Self-pay | Admitting: Family

## 2021-08-07 LAB — BASIC METABOLIC PANEL
BUN/Creatinine Ratio: 11 (ref 9–20)
BUN: 8 mg/dL (ref 6–20)
CO2: 22 mmol/L (ref 20–29)
Calcium: 9.6 mg/dL (ref 8.7–10.2)
Chloride: 102 mmol/L (ref 96–106)
Creatinine, Ser: 0.75 mg/dL — ABNORMAL LOW (ref 0.76–1.27)
Glucose: 129 mg/dL — ABNORMAL HIGH (ref 70–99)
Potassium: 4.4 mmol/L (ref 3.5–5.2)
Sodium: 138 mmol/L (ref 134–144)
eGFR: 127 mL/min/{1.73_m2} (ref >59)

## 2021-08-07 NOTE — Progress Notes (Signed)
Kidney function normal

## 2021-08-08 ENCOUNTER — Encounter: Payer: Self-pay | Admitting: Family

## 2021-08-08 ENCOUNTER — Other Ambulatory Visit: Payer: Self-pay

## 2021-08-08 DIAGNOSIS — K219 Gastro-esophageal reflux disease without esophagitis: Secondary | ICD-10-CM

## 2021-08-08 MED ORDER — OMEPRAZOLE 20 MG PO CPDR: 20.0000 mg | DELAYED_RELEASE_CAPSULE | Freq: Every day | ORAL | 0 refills | Status: DC

## 2021-08-17 NOTE — Progress Notes (Signed)
Patient ID: Zachary Sharp, male    DOB: Mar 27, 1991  MRN: [id number]  CC: Hypertension Follow-Up   Subjective: Zachary Sharp is a 30 y.o. male who presents for hypertension follow-up.   His concerns today include:  HYPERTENSION FOLLOW-UP: 11/29/022: - Begin Valsartan-Hydrochlorothiazide as prescribed.  08/20/2021: Doing well on current regimen. No side effects. No issues/concerns. Denies chest pain and shortness of breath. Home blood pressures similar to today's reading.   2. VASECTOMY: Would like to have vasectomy. He has 3 children with his wife.    Patient Active Problem List   Diagnosis Date Noted   Allergic rhinitis 08/06/2021   Asthma 08/06/2021   Essential (primary) hypertension 08/06/2021   History of chlamydia 08/06/2021   Keloid scar 08/06/2021   Morbid obesity (HCC) 08/06/2021   Knee pain, right 08/03/2012     Current Outpatient Medications on File Prior to Visit  Medication Sig Dispense Refill   amLODipine (NORVASC) 10 MG tablet Take 1 tablet (10 mg total) by mouth daily. (Patient not taking: Reported on 08/06/2021) 30 tablet 1   omeprazole (PRILOSEC) 20 MG capsule Take 1 capsule (20 mg total) by mouth daily. With breakfast and dinner while symptoms persist 30 capsule 0   No current facility-administered medications on file prior to visit.    No Known Allergies  Social History   Socioeconomic History   Marital status: Single    Spouse name: Not on file   Number of children: Not on file   Years of education: Not on file   Highest education level: Not on file  Occupational History   Not on file  Tobacco Use   Smoking status: Never   Smokeless tobacco: Never  Vaping Use   Vaping Use: Never used  Substance and Sexual Activity   Alcohol use: No   Drug use: No   Sexual activity: Yes  Other Topics Concern   Not on file  Social History Narrative   Not on file   Social Determinants of Health   Financial Resource Strain: Not on file  Food  Insecurity: Not on file  Transportation Needs: Not on file  Physical Activity: Not on file  Stress: Not on file  Social Connections: Not on file  Intimate Partner Violence: Not on file    Family History  Family history unknown: Yes    Past Surgical History:  Procedure Laterality Date   TONSILLECTOMY      ROS: Review of Systems Negative except as stated above  PHYSICAL EXAM: BP (!) 143/90 (BP Location: Left Arm, Patient Position: Sitting, Cuff Size: Large)   Pulse (!) 105   Temp 98.3 F (36.8 C)   Resp 18   Ht 5' 8.5" (1.74 m)   Wt (!) 385 lb (174.6 kg)   SpO2 97%   BMI 57.68 kg/m   Physical Exam HENT:     Head: Normocephalic and atraumatic.  Eyes:     Extraocular Movements: Extraocular movements intact.     Conjunctiva/sclera: Conjunctivae normal.     Pupils: Pupils are equal, round, and reactive to light.  Cardiovascular:     Rate and Rhythm: Normal rate and regular rhythm.     Pulses: Normal pulses.     Heart sounds: Normal heart sounds.  Pulmonary:     Effort: Pulmonary effort is normal.     Breath sounds: Normal breath sounds.  Musculoskeletal:     Cervical back: Normal range of motion and neck supple.  Neurological:     General: No focal  deficit present.     Mental Status: He is alert and oriented to person, place, and time.  Psychiatric:        Mood and Affect: Mood normal.        Behavior: Behavior normal.    ASSESSMENT AND PLAN: 1. Essential (primary) hypertension: - Blood pressure not at goal during today's visit. Patient asymptomatic without chest pressure, chest pain, palpitations, shortness of breath, worst headache of life, and any additional red flag symptoms. - Increase Valsartan-Hydrochlorothiazide as prescribed.  - Counseled on blood pressure goal of less than 130/80, low-sodium, DASH diet, medication compliance, 150 minutes of moderate intensity exercise per week as tolerated. Discussed medication compliance, adverse effects. - BMP to  evaluate kidney function and electrolyte balance. - Follow-up with primary provider in 2 weeks or sooner if needed.  - Basic Metabolic Panel - valsartan-hydrochlorothiazide (DIOVAN-HCT) 320-25 MG tablet; Take 1 tablet by mouth daily.  Dispense: 30 tablet; Refill: 0  2. Vasectomy evaluation: - Referral to Urology for further evaluation and management.  - Ambulatory referral to Urology  3. Need for Tdap vaccination: - Administered today in office. - Tdap vaccine greater than or equal to 7yo IM    Patient was given the opportunity to ask questions.  Patient verbalized understanding of the plan and was able to repeat key elements of the plan. Patient was given clear instructions to go to Emergency Department or return to medical center if symptoms don't improve, worsen, or new problems develop.The patient verbalized understanding.   Orders Placed This Encounter  Procedures   Tdap vaccine greater than or equal to 7yo IM   Basic Metabolic Panel   Ambulatory referral to Urology     Requested Prescriptions   Signed Prescriptions Disp Refills   valsartan-hydrochlorothiazide (DIOVAN-HCT) 320-25 MG tablet 30 tablet 0    Sig: Take 1 tablet by mouth daily.    Return in about 2 weeks (around 09/03/2021) for Follow-Up or next available hypertension .  Rema Fendt, NP

## 2021-08-20 ENCOUNTER — Other Ambulatory Visit: Payer: Self-pay

## 2021-08-20 ENCOUNTER — Ambulatory Visit: Payer: 59 | Admitting: Family

## 2021-08-20 VITALS — BP 143/90 | HR 105 | Temp 98.3°F | Resp 18 | Ht 68.5 in | Wt 385.0 lb

## 2021-08-20 DIAGNOSIS — Z23 Encounter for immunization: Secondary | ICD-10-CM

## 2021-08-20 DIAGNOSIS — I10 Essential (primary) hypertension: Secondary | ICD-10-CM | POA: Diagnosis not present

## 2021-08-20 DIAGNOSIS — Z3009 Encounter for other general counseling and advice on contraception: Secondary | ICD-10-CM

## 2021-08-20 MED ORDER — VALSARTAN-HYDROCHLOROTHIAZIDE 320-25 MG PO TABS: 1.0000 | ORAL_TABLET | Freq: Every day | ORAL | 0 refills | Status: DC

## 2021-08-20 NOTE — Progress Notes (Signed)
Pt presents for hypertension follow-up  ?

## 2021-08-21 LAB — BASIC METABOLIC PANEL
BUN/Creatinine Ratio: 10 (ref 9–20)
BUN: 7 mg/dL (ref 6–20)
CO2: 22 mmol/L (ref 20–29)
Calcium: 9.6 mg/dL (ref 8.7–10.2)
Chloride: 100 mmol/L (ref 96–106)
Creatinine, Ser: 0.7 mg/dL — ABNORMAL LOW (ref 0.76–1.27)
Glucose: 111 mg/dL — ABNORMAL HIGH (ref 70–99)
Potassium: 4.2 mmol/L (ref 3.5–5.2)
Sodium: 136 mmol/L (ref 134–144)
eGFR: 130 mL/min/{1.73_m2} (ref >59)

## 2021-08-21 NOTE — Progress Notes (Signed)
Kidney function normal

## 2021-08-26 NOTE — Progress Notes (Signed)
Erroneous encounter

## 2021-09-03 ENCOUNTER — Encounter: Payer: 59 | Admitting: Family

## 2021-09-13 ENCOUNTER — Other Ambulatory Visit: Payer: Self-pay | Admitting: Family

## 2021-09-13 DIAGNOSIS — I10 Essential (primary) hypertension: Secondary | ICD-10-CM

## 2021-10-04 NOTE — Progress Notes (Signed)
Erroneous encounter

## 2021-10-09 ENCOUNTER — Other Ambulatory Visit: Payer: Self-pay | Admitting: Family

## 2021-10-09 DIAGNOSIS — I10 Essential (primary) hypertension: Secondary | ICD-10-CM

## 2021-10-10 MED ORDER — VALSARTAN-HYDROCHLOROTHIAZIDE 320-25 MG PO TABS: 1.0000 | ORAL_TABLET | Freq: Every day | ORAL | 0 refills | Status: DC

## 2021-10-11 ENCOUNTER — Encounter: Payer: 59 | Admitting: Family

## 2021-10-11 DIAGNOSIS — I10 Essential (primary) hypertension: Secondary | ICD-10-CM

## 2021-11-07 ENCOUNTER — Telehealth: Payer: 59 | Admitting: Physician Assistant

## 2021-11-07 ENCOUNTER — Other Ambulatory Visit: Payer: Self-pay | Admitting: Family

## 2021-11-07 DIAGNOSIS — J4521 Mild intermittent asthma with (acute) exacerbation: Secondary | ICD-10-CM

## 2021-11-07 DIAGNOSIS — I10 Essential (primary) hypertension: Secondary | ICD-10-CM

## 2021-11-07 MED ORDER — ALBUTEROL SULFATE HFA 108 (90 BASE) MCG/ACT IN AERS: 2.0000 | INHALATION_SPRAY | Freq: Four times a day (QID) | RESPIRATORY_TRACT | 0 refills | Status: DC | PRN

## 2021-11-07 MED ORDER — PREDNISONE 20 MG PO TABS: 40.0000 mg | ORAL_TABLET | Freq: Every day | ORAL | 0 refills | Status: DC

## 2021-11-07 NOTE — Patient Instructions (Signed)
Zachary Sharp, thank you for joining Zachary Loveless, PA-C for today's virtual visit.  While this provider is not your primary care provider (PCP), if your PCP is located in our provider database this encounter information will be shared with them immediately following your visit.  Consent: (Patient) Zachary Sharp provided verbal consent for this virtual visit at the beginning of the encounter.  Current Medications:  Current Outpatient Medications:    albuterol (VENTOLIN HFA) 108 (90 Base) MCG/ACT inhaler, Inhale 2 puffs into the lungs every 6 (six) hours as needed for wheezing or shortness of breath., Disp: 8 g, Rfl: 0   predniSONE (DELTASONE) 20 MG tablet, Take 2 tablets (40 mg total) by mouth daily with breakfast., Disp: 14 tablet, Rfl: 0   amLODipine (NORVASC) 10 MG tablet, Take 1 tablet (10 mg total) by mouth daily. (Patient not taking: Reported on 08/06/2021), Disp: 30 tablet, Rfl: 1   omeprazole (PRILOSEC) 20 MG capsule, Take 1 capsule (20 mg total) by mouth daily. With breakfast and dinner while symptoms persist, Disp: 30 capsule, Rfl: 0   valsartan-hydrochlorothiazide (DIOVAN-HCT) 320-25 MG tablet, Take 1 tablet by mouth daily., Disp: 30 tablet, Rfl: 0   Medications ordered in this encounter:  Meds ordered this encounter  Medications   albuterol (VENTOLIN HFA) 108 (90 Base) MCG/ACT inhaler    Sig: Inhale 2 puffs into the lungs every 6 (six) hours as needed for wheezing or shortness of breath.    Dispense:  8 g    Refill:  0    Order Specific Question:   Supervising Provider    Answer:   MILLER, BRIAN [[id number]]   predniSONE (DELTASONE) 20 MG tablet    Sig: Take 2 tablets (40 mg total) by mouth daily with breakfast.    Dispense:  14 tablet    Refill:  0    Order Specific Question:   Supervising Provider    Answer:   Eber Hong [[id number]]     *If you need refills on other medications prior to your next appointment, please contact your pharmacy*  Follow-Up: Call back or  seek an in-person evaluation if the symptoms worsen or if the condition fails to improve as anticipated.  Other Instructions Asthma, Adult Asthma is a long-term (chronic) condition in which the airways get tight and narrow. The airways are the breathing passages that lead from the nose and mouth down into the lungs. A person with asthma will have times when symptoms get worse. These are called asthma attacks. They can cause coughing, whistling sounds when you breathe (wheezing), shortness of breath, and chest pain. They can make it hard to breathe. There is no cure for asthma, but medicines and lifestyle changes can help control it. There are many things that can bring on an asthma attack or make asthma symptoms worse (triggers). Common triggers include: Mold. Dust. Cigarette smoke. Cockroaches. Things that can cause allergy symptoms (allergens). These include animal skin flakes (dander) and pollen from trees or grass. Things that pollute the air. These may include household cleaners, wood smoke, smog, or chemical odors. Cold air, weather changes, and wind. Crying or laughing hard. Stress. Certain medicines or drugs. Certain foods such as dried fruit, potato chips, and grape juice. Infections, such as a cold or the flu. Certain medical conditions or diseases. Exercise or tiring activities. Asthma may be treated with medicines and by staying away from the things that cause asthma attacks. Types of medicines may include: Controller medicines. These help prevent asthma symptoms. They are  usually taken every day. Fast-acting reliever or rescue medicines. These quickly relieve asthma symptoms. They are used as needed and provide short-term relief. Allergy medicines if your attacks are brought on by allergens. Medicines to help control the body's defense (immune) system. Follow these instructions at home: Avoiding triggers in your home Change your heating and air conditioning filter  often. Limit your use of fireplaces and wood stoves. Get rid of pests (such as roaches and mice) and their droppings. Throw away plants if you see mold on them. Clean your floors. Dust regularly. Use cleaning products that do not smell. Have someone vacuum when you are not home. Use a vacuum cleaner with a HEPA filter if possible. Replace carpet with wood, tile, or vinyl flooring. Carpet can trap animal skin flakes and dust. Use allergy-proof pillows, mattress covers, and box spring covers. Wash bed sheets and blankets every week in hot water. Dry them in a dryer. Keep your bedroom free of any triggers. Avoid pets and keep windows closed when things that cause allergy symptoms are in the air. Use blankets that are made of polyester or cotton. Clean bathrooms and kitchens with bleach. If possible, have someone repaint the walls in these rooms with mold-resistant paint. Keep out of the rooms that are being cleaned and painted. Wash your hands often with soap and water. If soap and water are not available, use hand sanitizer. Do not allow anyone to smoke in your home. General instructions Take over-the-counter and prescription medicines only as told by your doctor. Talk with your doctor if you have questions about how or when to take your medicines. Make note if you need to use your medicines more often than usual. Do not use any products that contain nicotine or tobacco, such as cigarettes and e-cigarettes. If you need help quitting, ask your doctor. Stay away from secondhand smoke. Avoid doing things outdoors when allergen counts are high and when air quality is low. Wear a ski mask when doing outdoor activities in the winter. The mask should cover your nose and mouth. Exercise indoors on cold days if you can. Warm up before you exercise. Take time to cool down after exercise. Use a peak flow meter as told by your doctor. A peak flow meter is a tool that measures how well the lungs are  working. Keep track of the peak flow meter's readings. Write them down. Follow your asthma action plan. This is a written plan for taking care of your asthma and treating your attacks. Make sure you get all the shots (vaccines) that your doctor recommends. Ask your doctor about a flu shot and a pneumonia shot. Keep all follow-up visits as told by your doctor. This is important. Contact a doctor if: You have wheezing, shortness of breath, or a cough even while taking medicine to prevent attacks. The mucus you cough up (sputum) is thicker than usual. The mucus you cough up changes from clear or white to yellow, green, gray, or bloody. You have problems from the medicine you are taking, such as: A rash. Itching. Swelling. Trouble breathing. You need reliever medicines more than 2-3 times a week. Your peak flow reading is still at 50-79% of your personal best after following the action plan for 1 hour. You have a fever. Get help right away if: You seem to be worse and are not responding to medicine during an asthma attack. You are short of breath even at rest. You get short of breath when doing very little activity.  You have trouble eating, drinking, or talking. You have chest pain or tightness. You have a fast heartbeat. Your lips or fingernails start to turn blue. You are light-headed or dizzy, or you faint. Your peak flow is less than 50% of your personal best. You feel too tired to breathe normally. Summary Asthma is a long-term (chronic) condition in which the airways get tight and narrow. An asthma attack can make it hard to breathe. Asthma cannot be cured, but medicines and lifestyle changes can help control it. Make sure you understand how to avoid triggers and how and when to use your medicines. This information is not intended to replace advice given to you by your health care provider. Make sure you discuss any questions you have with your health care provider. Document  Revised: 12/18/2019 Document Reviewed: 12/28/2019 Elsevier Patient Education  2022 [employer].    If you have been instructed to have an in-person evaluation today at a local Urgent Care facility, please use the link below. It will take you to a list of all of our available [facility], including address, phone number and hours of operation. Please do not delay care.  [facility]  If you or a family member do not have a primary care provider, use the link below to schedule a visit and establish care. When you choose a [facility] primary care physician or advanced practice provider, you gain a long-term partner in health. Find a Primary Care Provider  Learn more about [facility]'s in-office and virtual care options:  - Get Care Now

## 2021-11-07 NOTE — Progress Notes (Signed)
Erroneous encounter

## 2021-11-07 NOTE — Progress Notes (Signed)
?Virtual Visit Consent  ? ?Zachary Sharp, you are scheduled for a virtual visit with a [facility] provider today.   ?  ?Just as with appointments in the office, your consent must be obtained to participate.  Your consent will be active for this visit and any virtual visit you may have with one of our providers in the next 365 days.   ?  ?If you have a MyChart account, a copy of this consent can be sent to you electronically.  All virtual visits are billed to your insurance company just like a traditional visit in the office.   ? ?As this is a virtual visit, video technology does not allow for your provider to perform a traditional examination.  This may limit your provider's ability to fully assess your condition.  If your provider identifies any concerns that need to be evaluated in person or the need to arrange testing (such as labs, EKG, etc.), we will make arrangements to do so.   ?  ?Although advances in technology are sophisticated, we cannot ensure that it will always work on either your end or our end.  If the connection with a video visit is poor, the visit may have to be switched to a telephone visit.  With either a video or telephone visit, we are not always able to ensure that we have a secure connection.    ? ?I need to obtain your verbal consent now.   Are you willing to proceed with your visit today?  ?  ?Zachary Sharp has provided verbal consent on 11/07/2021 for a virtual visit (video or telephone). ?  ?Zachary Loveless, PA-C  ? ?Date: 11/07/2021 6:04 PM ? ? ?Virtual Visit via Video Note  ? ?Zachary Sharp, connected with  Zachary Sharp  ([id number], [birth year]) on 11/07/21 at  5:30 PM EST by a video-enabled telemedicine application and verified that I am speaking with the correct person using two identifiers. ? ?Location: ?Patient: Virtual Visit Location Patient: Mobile ?Provider: Virtual Visit Location Provider: Home Office ?  ?I discussed the limitations of evaluation and management  by telemedicine and the availability of in person appointments. The patient expressed understanding and agreed to proceed.   ? ?History of Present Illness: ?Zachary Sharp is a 31 y.o. who identifies as a male who was assigned male at birth, and is being seen today for cough. ? ?HPI: Cough ?This is a new problem. The current episode started 1 to 4 weeks ago (1 week ago). The problem has been gradually worsening. The problem occurs hourly. The cough is Non-productive. Associated symptoms include chest pain (tightness), a sore throat (started yesterday) and wheezing. Pertinent negatives include no chills, ear congestion, ear pain, fever, headaches, myalgias, nasal congestion, postnasal drip, rhinorrhea or sweats. The symptoms are aggravated by lying down. Treatments tried: mucinex dm, mucinex fast max, singulair, allergy medicine. The treatment provided no relief. His past medical history is significant for asthma (as a kid) and environmental allergies. There is no history of bronchitis or pneumonia.   ? ? ?Problems:  ?Patient Active Problem List  ? Diagnosis Date Noted  ? Allergic rhinitis 08/06/2021  ? Asthma 08/06/2021  ? Essential (primary) hypertension 08/06/2021  ? History of chlamydia 08/06/2021  ? Keloid scar 08/06/2021  ? Morbid obesity (HCC) 08/06/2021  ? Knee pain, right 08/03/2012  ?  ?Allergies: No Known Allergies ?Medications:  ?Current Outpatient Medications:  ?  albuterol (VENTOLIN HFA) 108 (90 Base) MCG/ACT inhaler, Inhale 2 puffs into the  lungs every 6 (six) hours as needed for wheezing or shortness of breath., Disp: 8 g, Rfl: 0 ?  predniSONE (DELTASONE) 20 MG tablet, Take 2 tablets (40 mg total) by mouth daily with breakfast., Disp: 14 tablet, Rfl: 0 ?  amLODipine (NORVASC) 10 MG tablet, Take 1 tablet (10 mg total) by mouth daily. (Patient not taking: Reported on 08/06/2021), Disp: 30 tablet, Rfl: 1 ?  omeprazole (PRILOSEC) 20 MG capsule, Take 1 capsule (20 mg total) by mouth daily. With breakfast  and dinner while symptoms persist, Disp: 30 capsule, Rfl: 0 ?  valsartan-hydrochlorothiazide (DIOVAN-HCT) 320-25 MG tablet, Take 1 tablet by mouth daily., Disp: 30 tablet, Rfl: 0 ? ?Observations/Objective: ?Patient is well-developed, well-nourished in no acute distress.  ?Resting comfortably  ?Head is normocephalic, atraumatic.  ?No labored breathing.  ?Speech is clear and coherent with logical content.  ?Patient is alert and oriented at baseline.  ? ? ?Assessment and Plan: ?1. Mild intermittent asthmatic bronchitis with acute exacerbation ?- albuterol (VENTOLIN HFA) 108 (90 Base) MCG/ACT inhaler; Inhale 2 puffs into the lungs every 6 (six) hours as needed for wheezing or shortness of breath.  Dispense: 8 g; Refill: 0 ?- predniSONE (DELTASONE) 20 MG tablet; Take 2 tablets (40 mg total) by mouth daily with breakfast.  Dispense: 14 tablet; Refill: 0 ? ?- Suspect possible reactive airway/asthmatic bronchitis ?- Add albuterol and prednisone ?- Steam treatment and humidifier can help ?- Push fluids ?- Rest ?- Keep scheduled f/u with PCP tomorrow ? ?Follow Up Instructions: ?I discussed the assessment and treatment plan with the patient. The patient was provided an opportunity to ask questions and all were answered. The patient agreed with the plan and demonstrated an understanding of the instructions.  A copy of instructions were sent to the patient via MyChart unless otherwise noted below.  ? ?The patient was advised to call back or seek an in-person evaluation if the symptoms worsen or if the condition fails to improve as anticipated. ? ?Time:  ?I spent 12 minutes with the patient via telehealth technology discussing the above problems/concerns.   ? ?Zachary Loveless, PA-C ?

## 2021-11-08 ENCOUNTER — Encounter: Payer: 59 | Admitting: Family

## 2021-11-08 DIAGNOSIS — I10 Essential (primary) hypertension: Secondary | ICD-10-CM

## 2021-11-11 ENCOUNTER — Ambulatory Visit: Payer: 59 | Admitting: Nurse Practitioner

## 2021-11-17 NOTE — Progress Notes (Unsigned)
Patient ID: Zachary Sharp, male    DOB: 09/08/1991  MRN: [id number]  CC: Hypertension Follow-Up  Subjective: Zachary Sharp is a 31 y.o. male who presents for hypertension follow-up.   His concerns today include:  HYPERTENSION FOLLOW-UP: 08/20/2021: - Increase Valsartan-Hydrochlorothiazide as prescribed.   11/22/2021:  Patient Active Problem List   Diagnosis Date Noted   Allergic rhinitis 08/06/2021   Asthma 08/06/2021   Essential (primary) hypertension 08/06/2021   History of chlamydia 08/06/2021   Keloid scar 08/06/2021   Morbid obesity (HCC) 08/06/2021   Knee pain, right 08/03/2012     Current Outpatient Medications on File Prior to Visit  Medication Sig Dispense Refill   albuterol (VENTOLIN HFA) 108 (90 Base) MCG/ACT inhaler Inhale 2 puffs into the lungs every 6 (six) hours as needed for wheezing or shortness of breath. 8 g 0   amLODipine (NORVASC) 10 MG tablet Take 1 tablet (10 mg total) by mouth daily. (Patient not taking: Reported on 08/06/2021) 30 tablet 1   omeprazole (PRILOSEC) 20 MG capsule Take 1 capsule (20 mg total) by mouth daily. With breakfast and dinner while symptoms persist 30 capsule 0   predniSONE (DELTASONE) 20 MG tablet Take 2 tablets (40 mg total) by mouth daily with breakfast. 14 tablet 0   valsartan-hydrochlorothiazide (DIOVAN-HCT) 320-25 MG tablet Take 1 tablet by mouth daily. 30 tablet 0   No current facility-administered medications on file prior to visit.    No Known Allergies  Social History   Socioeconomic History   Marital status: Married    Spouse name: Not on file   Number of children: Not on file   Years of education: Not on file   Highest education level: Not on file  Occupational History   Not on file  Tobacco Use   Smoking status: Never   Smokeless tobacco: Never  Vaping Use   Vaping Use: Never used  Substance and Sexual Activity   Alcohol use: No   Drug use: No   Sexual activity: Yes  Other Topics Concern   Not  on file  Social History Narrative   Not on file   Social Determinants of Health   Financial Resource Strain: Not on file  Food Insecurity: Not on file  Transportation Needs: Not on file  Physical Activity: Not on file  Stress: Not on file  Social Connections: Not on file  Intimate Partner Violence: Not on file    Family History  Family history unknown: Yes    Past Surgical History:  Procedure Laterality Date   TONSILLECTOMY      ROS: Review of Systems Negative except as stated above  PHYSICAL EXAM: There were no vitals taken for this visit.  Physical Exam  {male adult master:310786} {male adult master:310785}  CMP Latest Ref Rng & Units 08/20/2021 08/06/2021  Glucose 70 - 99 mg/dL 858(I) 502(D)  BUN 6 - 20 mg/dL 7 8  Creatinine 7.41 - 1.27 mg/dL 2.87(O) 6.76(H)  Sodium 134 - 144 mmol/L 136 138  Potassium 3.5 - 5.2 mmol/L 4.2 4.4  Chloride 96 - 106 mmol/L 100 102  CO2 20 - 29 mmol/L 22 22  Calcium 8.7 - 10.2 mg/dL 9.6 9.6   Lipid Panel  No results found for: CHOL, TRIG, HDL, CHOLHDL, VLDL, LDLCALC, LDLDIRECT  CBC No results found for: WBC, RBC, HGB, HCT, PLT, MCV, MCH, MCHC, RDW, LYMPHSABS, MONOABS, EOSABS, BASOSABS  ASSESSMENT AND PLAN:  There are no diagnoses linked to this encounter.   Patient was given the  opportunity to ask questions.  Patient verbalized understanding of the plan and was able to repeat key elements of the plan. Patient was given clear instructions to go to Emergency Department or return to medical center if symptoms don't improve, worsen, or new problems develop.The patient verbalized understanding.   No orders of the defined types were placed in this encounter.    Requested Prescriptions    No prescriptions requested or ordered in this encounter    No follow-ups on file.  Zachary Fendt, NP

## 2021-11-22 ENCOUNTER — Other Ambulatory Visit: Payer: Self-pay

## 2021-11-22 ENCOUNTER — Encounter: Payer: Self-pay | Admitting: Family

## 2021-11-22 ENCOUNTER — Ambulatory Visit (INDEPENDENT_AMBULATORY_CARE_PROVIDER_SITE_OTHER): Payer: 59

## 2021-11-22 ENCOUNTER — Ambulatory Visit (INDEPENDENT_AMBULATORY_CARE_PROVIDER_SITE_OTHER): Payer: 59 | Admitting: Family

## 2021-11-22 VITALS — BP 146/88 | HR 100 | Temp 98.3°F | Resp 18 | Ht 68.5 in | Wt 394.0 lb

## 2021-11-22 DIAGNOSIS — J4521 Mild intermittent asthma with (acute) exacerbation: Secondary | ICD-10-CM | POA: Diagnosis not present

## 2021-11-22 DIAGNOSIS — I10 Essential (primary) hypertension: Secondary | ICD-10-CM | POA: Diagnosis not present

## 2021-11-22 MED ORDER — PREDNISONE 20 MG PO TABS: 40.0000 mg | ORAL_TABLET | Freq: Every day | ORAL | 0 refills | Status: DC

## 2021-11-22 MED ORDER — VALSARTAN-HYDROCHLOROTHIAZIDE 320-25 MG PO TABS: 1.0000 | ORAL_TABLET | Freq: Every day | ORAL | 0 refills | Status: DC

## 2021-11-22 MED ORDER — AMLODIPINE BESYLATE 5 MG PO TABS: 5.0000 mg | ORAL_TABLET | Freq: Every day | ORAL | 0 refills | Status: DC

## 2021-11-22 MED ORDER — AMLODIPINE BESYLATE 5 MG PO TABS
5.0000 mg | ORAL_TABLET | Freq: Every day | ORAL | 0 refills | Status: DC
Start: 2021-11-22 — End: 2021-11-22

## 2021-11-22 MED ORDER — ALBUTEROL SULFATE HFA 108 (90 BASE) MCG/ACT IN AERS: 2.0000 | INHALATION_SPRAY | Freq: Four times a day (QID) | RESPIRATORY_TRACT | 1 refills | Status: DC | PRN

## 2021-11-22 NOTE — Progress Notes (Signed)
Pt presents for hypertension follow-up  ?

## 2021-11-23 LAB — BASIC METABOLIC PANEL
BUN/Creatinine Ratio: 9 (ref 9–20)
BUN: 8 mg/dL (ref 6–20)
CO2: 26 mmol/L (ref 20–29)
Calcium: 10.2 mg/dL (ref 8.7–10.2)
Chloride: 100 mmol/L (ref 96–106)
Creatinine, Ser: 0.85 mg/dL (ref 0.76–1.27)
Glucose: 84 mg/dL (ref 70–99)
Potassium: 4.5 mmol/L (ref 3.5–5.2)
Sodium: 139 mmol/L (ref 134–144)
eGFR: 121 mL/min/{1.73_m2} (ref >59)

## 2021-11-24 ENCOUNTER — Other Ambulatory Visit: Payer: Self-pay | Admitting: Family

## 2021-11-24 DIAGNOSIS — J4521 Mild intermittent asthma with (acute) exacerbation: Secondary | ICD-10-CM

## 2021-11-24 NOTE — Progress Notes (Signed)
Chest x-ray shows low lung volumes without acute cardiopulmonary disease. Referral to Pulmonology for further evaluation and management in addition to asthma and bronchitis. Their office should call patient within 2 weeks with appointment details.

## 2021-11-24 NOTE — Progress Notes (Signed)
Kidney function and electrolytes normal.

## 2021-12-04 NOTE — Progress Notes (Signed)
? ? ? ?Patient ID: Zachary Sharp, male    DOB: 1991/03/22  MRN: [id number] ? ?CC: Hypertension Follow-Up ? ?Subjective: ?Zachary Sharp is a 31 y.o. male who presents for hypertension follow-up.  ? ?His concerns today include:  ?HYPERTENSION FOLLOW-UP: ?11/22/2021: ?- Continue Valsartan-Hydrochlorothiazide as prescribed.  ?- Begin Amlodipine as prescribed. ? ?12/09/2021: ?Doing well on current regimen. No side effects. No issues/concerns. Denies chest pain and shortness of breath.  ? ?Patient Active Problem List  ? Diagnosis Date Noted  ? Allergic rhinitis 08/06/2021  ? Asthma 08/06/2021  ? Essential (primary) hypertension 08/06/2021  ? Keloid scar 08/06/2021  ? Morbid obesity (HCC) 08/06/2021  ? Knee pain, right 08/03/2012  ?  ? ?Current Outpatient Medications on File Prior to Visit  ?Medication Sig Dispense Refill  ? albuterol (VENTOLIN HFA) 108 (90 Base) MCG/ACT inhaler Inhale 2 puffs into the lungs every 6 (six) hours as needed for wheezing or shortness of breath. 8 g 1  ? amLODipine (NORVASC) 5 MG tablet Take 1 tablet (5 mg total) by mouth daily. 90 tablet 0  ? omeprazole (PRILOSEC) 20 MG capsule Take 1 capsule (20 mg total) by mouth daily. With breakfast and dinner while symptoms persist 30 capsule 0  ? predniSONE (DELTASONE) 20 MG tablet Take 2 tablets (40 mg total) by mouth daily with breakfast. 14 tablet 0  ? valsartan-hydrochlorothiazide (DIOVAN-HCT) 320-25 MG tablet Take 1 tablet by mouth daily. 90 tablet 0  ? ?No current facility-administered medications on file prior to visit.  ? ? ?No Known Allergies ? ?Social History  ? ?Socioeconomic History  ? Marital status: Married  ?  Spouse name: Not on file  ? Number of children: Not on file  ? Years of education: Not on file  ? Highest education level: Not on file  ?Occupational History  ? Not on file  ?Tobacco Use  ? Smoking status: Never  ?  Passive exposure: Never  ? Smokeless tobacco: Never  ?Vaping Use  ? Vaping Use: Never used  ?Substance and Sexual  Activity  ? Alcohol use: No  ? Drug use: No  ? Sexual activity: Yes  ?Other Topics Concern  ? Not on file  ?Social History Narrative  ? Not on file  ? ?Social Determinants of Health  ? ?Financial Resource Strain: Not on file  ?Food Insecurity: Not on file  ?Transportation Needs: Not on file  ?Physical Activity: Not on file  ?Stress: Not on file  ?Social Connections: Not on file  ?Intimate Partner Violence: Not on file  ? ? ?Family History  ?Family history unknown: Yes  ? ? ?Past Surgical History:  ?Procedure Laterality Date  ? TONSILLECTOMY    ? ? ?ROS: ?Review of Systems ?Negative except as stated above ? ?PHYSICAL EXAM: ?BP 138/83 (BP Location: Left Arm, Patient Position: Sitting, Cuff Size: Large)   Pulse 100   Temp 98.3 ?F (36.8 ?C)   Resp 18   Ht 5' 8.5" (1.74 m)   Wt (!) 392 lb (177.8 kg)   SpO2 98%   BMI 58.73 kg/m?  ? ?Physical Exam ?HENT:  ?   Head: Normocephalic and atraumatic.  ?Eyes:  ?   Extraocular Movements: Extraocular movements intact.  ?   Conjunctiva/sclera: Conjunctivae normal.  ?   Pupils: Pupils are equal, round, and reactive to light.  ?Cardiovascular:  ?   Rate and Rhythm: Normal rate and regular rhythm.  ?   Pulses: Normal pulses.  ?   Heart sounds: Normal heart sounds.  ?Pulmonary:  ?  Effort: Pulmonary effort is normal.  ?   Breath sounds: Normal breath sounds.  ?Musculoskeletal:  ?   Cervical back: Normal range of motion and neck supple.  ?Neurological:  ?   General: No focal deficit present.  ?   Mental Status: He is alert and oriented to person, place, and time.  ?Psychiatric:     ?   Mood and Affect: Mood normal.     ?   Behavior: Behavior normal.  ? ?ASSESSMENT AND PLAN: ?1. Essential (primary) hypertension: ?- Continue Valsartan-Hydrochlorothiazide and Amlodipine. No refills needed as of present.  ?- Counseled on blood pressure goal of less than 130/80, low-sodium, DASH diet, medication compliance, and 150 minutes of moderate intensity exercise per week as tolerated.  Counseled on medication adherence and adverse effects. ?- Follow-up with primary provider in 3 months or sooner if needed.  ? ? ? ?Patient was given the opportunity to ask questions.  Patient verbalized understanding of the plan and was able to repeat key elements of the plan. Patient was given clear instructions to go to Emergency Department or return to medical center if symptoms don't improve, worsen, or new problems develop.The patient verbalized understanding. ? ? ? ?Requested Prescriptions  ? ? No prescriptions requested or ordered in this encounter  ? ? ?Return in about 3 months (around 03/10/2022) for Follow-Up or next available hypertension . ? ?Rema Fendt, NP  ?

## 2021-12-09 ENCOUNTER — Ambulatory Visit (INDEPENDENT_AMBULATORY_CARE_PROVIDER_SITE_OTHER): Payer: Self-pay | Admitting: Family

## 2021-12-09 VITALS — BP 138/83 | HR 100 | Temp 98.3°F | Resp 18 | Ht 68.5 in | Wt 392.0 lb

## 2021-12-09 DIAGNOSIS — I10 Essential (primary) hypertension: Secondary | ICD-10-CM

## 2021-12-09 NOTE — Progress Notes (Signed)
Pt presents for hypertension follow-up  ?

## 2022-01-03 ENCOUNTER — Institutional Professional Consult (permissible substitution): Payer: Medicaid Other | Admitting: Pulmonary Disease

## 2022-01-29 ENCOUNTER — Other Ambulatory Visit: Payer: Self-pay | Admitting: Family

## 2022-01-29 DIAGNOSIS — I10 Essential (primary) hypertension: Secondary | ICD-10-CM

## 2022-01-30 MED ORDER — VALSARTAN-HYDROCHLOROTHIAZIDE 320-25 MG PO TABS: 1.0000 | ORAL_TABLET | Freq: Every day | ORAL | 0 refills | Status: DC

## 2022-02-19 ENCOUNTER — Other Ambulatory Visit: Payer: Self-pay | Admitting: Family

## 2022-02-19 ENCOUNTER — Encounter: Payer: Self-pay | Admitting: Family

## 2022-02-19 DIAGNOSIS — I10 Essential (primary) hypertension: Secondary | ICD-10-CM

## 2022-03-03 NOTE — Progress Notes (Deleted)
Patient ID: Zachary Sharp, male    DOB: 08/26/1991  MRN: [id number]  CC: Hypertension Follow-Up  Subjective: Zachary Sharp is a 31 y.o. male who presents for hypertension follow-up.   His concerns today include:  Valsartan-Hydrochlorothiazide and Amlodipine  Patient Active Problem List   Diagnosis Date Noted   Allergic rhinitis 08/06/2021   Asthma 08/06/2021   Essential (primary) hypertension 08/06/2021   Keloid scar 08/06/2021   Morbid obesity (HCC) 08/06/2021   Knee pain, right 08/03/2012     Current Outpatient Medications on File Prior to Visit  Medication Sig Dispense Refill   albuterol (VENTOLIN HFA) 108 (90 Base) MCG/ACT inhaler Inhale 2 puffs into the lungs every 6 (six) hours as needed for wheezing or shortness of breath. 8 g 1   amLODipine (NORVASC) 5 MG tablet Take 1 tablet (5 mg total) by mouth daily. 90 tablet 0   omeprazole (PRILOSEC) 20 MG capsule Take 1 capsule (20 mg total) by mouth daily. With breakfast and dinner while symptoms persist 30 capsule 0   predniSONE (DELTASONE) 20 MG tablet Take 2 tablets (40 mg total) by mouth daily with breakfast. 14 tablet 0   valsartan-hydrochlorothiazide (DIOVAN-HCT) 320-25 MG tablet Take 1 tablet by mouth daily. 90 tablet 0   No current facility-administered medications on file prior to visit.    No Known Allergies  Social History   Socioeconomic History   Marital status: Married    Spouse name: Not on file   Number of children: Not on file   Years of education: Not on file   Highest education level: Not on file  Occupational History   Not on file  Tobacco Use   Smoking status: Never    Passive exposure: Never   Smokeless tobacco: Never  Vaping Use   Vaping Use: Never used  Substance and Sexual Activity   Alcohol use: No   Drug use: No   Sexual activity: Yes  Other Topics Concern   Not on file  Social History Narrative   Not on file   Social Determinants of Health   Financial Resource Strain: Not  on file  Food Insecurity: Not on file  Transportation Needs: Not on file  Physical Activity: Not on file  Stress: Not on file  Social Connections: Not on file  Intimate Partner Violence: Not on file    Family History  Family history unknown: Yes    Past Surgical History:  Procedure Laterality Date   TONSILLECTOMY      ROS: Review of Systems Negative except as stated above  PHYSICAL EXAM: There were no vitals taken for this visit.  Physical Exam  {male adult master:310786} {male adult master:310785}     Latest Ref Rng & Units 11/22/2021    3:04 PM 08/20/2021    9:29 AM 08/06/2021    9:14 AM  CMP  Glucose 70 - 99 mg/dL 84  027  253   BUN 6 - 20 mg/dL 8  7  8    Creatinine 0.76 - 1.27 mg/dL  6.64  4.03   Sodium 134 - 144 mmol/L 139  136  138   Potassium 3.5 - 5.2 mmol/L 4.5  4.2  4.4   Chloride 96 - 106 mmol/L 100  100  102   CO2 20 - 29 mmol/L 26  22  22    Calcium 8.7 - 10.2 mg/dL 4.74  9.6  9.6    Lipid Panel  No results found for: "CHOL", "TRIG", "HDL", "CHOLHDL", "VLDL", "LDLCALC", "LDLDIRECT"  CBC No results found for: "WBC", "RBC", "HGB", "HCT", "PLT", "MCV", "MCH", "MCHC", "RDW", "LYMPHSABS", "MONOABS", "EOSABS", "BASOSABS"  ASSESSMENT AND PLAN:  There are no diagnoses linked to this encounter.   Patient was given the opportunity to ask questions.  Patient verbalized understanding of the plan and was able to repeat key elements of the plan. Patient was given clear instructions to go to Emergency Department or return to medical center if symptoms don't improve, worsen, or new problems develop.The patient verbalized understanding.   No orders of the defined types were placed in this encounter.    Requested Prescriptions    No prescriptions requested or ordered in this encounter    No follow-ups on file.  Rema Fendt, NP

## 2022-03-10 ENCOUNTER — Ambulatory Visit: Payer: 59 | Admitting: Family

## 2022-03-10 DIAGNOSIS — I10 Essential (primary) hypertension: Secondary | ICD-10-CM

## 2022-03-17 NOTE — Progress Notes (Signed)
Erroneous encounter-disregard

## 2022-03-24 ENCOUNTER — Encounter: Payer: 59 | Admitting: Family

## 2022-03-24 DIAGNOSIS — I10 Essential (primary) hypertension: Secondary | ICD-10-CM

## 2022-04-21 ENCOUNTER — Telehealth: Payer: BC Managed Care – PPO | Admitting: Nurse Practitioner

## 2022-04-21 DIAGNOSIS — F5112 Insufficient sleep syndrome: Secondary | ICD-10-CM | POA: Diagnosis not present

## 2022-04-21 DIAGNOSIS — G4719 Other hypersomnia: Secondary | ICD-10-CM | POA: Diagnosis not present

## 2022-04-21 DIAGNOSIS — I10 Essential (primary) hypertension: Secondary | ICD-10-CM

## 2022-04-21 MED ORDER — AMLODIPINE BESYLATE 5 MG PO TABS: 5.0000 mg | ORAL_TABLET | Freq: Every day | ORAL | 0 refills | Status: DC

## 2022-04-21 NOTE — Progress Notes (Signed)
Virtual Visit Consent   Filbert Craze, you are scheduled for a virtual visit with a [facility] provider today. Just as with appointments in the office, your consent must be obtained to participate. Your consent will be active for this visit and any virtual visit you may have with one of our providers in the next 365 days. If you have a MyChart account, a copy of this consent can be sent to you electronically.  As this is a virtual visit, video technology does not allow for your provider to perform a traditional examination. This may limit your provider's ability to fully assess your condition. If your provider identifies any concerns that need to be evaluated in person or the need to arrange testing (such as labs, EKG, etc.), we will make arrangements to do so. Although advances in technology are sophisticated, we cannot ensure that it will always work on either your end or our end. If the connection with a video visit is poor, the visit may have to be switched to a telephone visit. With either a video or telephone visit, we are not always able to ensure that we have a secure connection.  By engaging in this virtual visit, you consent to the provision of healthcare and authorize for your insurance to be billed (if applicable) for the services provided during this visit. Depending on your insurance coverage, you may receive a charge related to this service.  I need to obtain your verbal consent now. Are you willing to proceed with your visit today? Zachary Sharp has provided verbal consent on 04/21/2022 for a virtual visit (video or telephone). Viviano Simas, FNP  Date: 04/21/2022 12:01 PM  Virtual Visit via Video Note   I, Viviano Simas, connected with  Zachary Sharp  ([id number], [birth year]) on 04/21/22 at 12:00 PM EDT by a video-enabled telemedicine application and verified that I am speaking with the correct person using two identifiers.  Location: Patient: Virtual Visit Location Patient:  Home Provider: Virtual Visit Location Provider: Home Office   I discussed the limitations of evaluation and management by telemedicine and the availability of in person appointments. The patient expressed understanding and agreed to proceed.    History of Present Illness: Zachary Sharp is a 31 y.o. who identifies as a male who was assigned male at birth, and is being seen today for follow up for his blood pressure.  He went for a sleep study consultation today and his BP was high at that time.  He noted that he had not taken his medications for 2.5 weeks prior to his appointment with the sleep center.   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   He has a BP monitor at home.   His BP at the office today was: (used a forarm cuff) 189/121  Denies symptoms  Problems:  Patient Active Problem List   Diagnosis Date Noted   Allergic rhinitis 08/06/2021   Asthma 08/06/2021   Essential (primary) hypertension 08/06/2021   Keloid scar 08/06/2021   Morbid obesity (HCC) 08/06/2021   Knee pain, right 08/03/2012    Allergies: No Known Allergies Medications:  Current Outpatient Medications:    albuterol (VENTOLIN HFA) 108 (90 Base) MCG/ACT inhaler, Inhale 2 puffs into the lungs every 6 (six) hours as needed for wheezing or shortness of breath., Disp: 8 g, Rfl: 1   amLODipine (NORVASC) 5 MG tablet, Take 1  tablet (5 mg total) by mouth daily., Disp: 90 tablet, Rfl: 0   omeprazole (PRILOSEC) 20 MG capsule, Take 1 capsule (20 mg total) by mouth daily. With breakfast and dinner while symptoms persist, Disp: 30 capsule, Rfl: 0   predniSONE (DELTASONE) 20 MG tablet, Take 2 tablets (40 mg total) by mouth daily with breakfast., Disp: 14 tablet, Rfl: 0   valsartan-hydrochlorothiazide (DIOVAN-HCT) 320-25 MG tablet, Take 1 tablet by mouth daily., Disp: 90 tablet, Rfl:  0  Observations/Objective: Patient is well-developed, well-nourished in no acute distress.  Resting comfortably  at home.  Head is normocephalic, atraumatic.  No labored breathing.  Speech is clear and coherent with logical content.  Patient is alert and oriented at baseline.    Assessment and Plan: 1. Essential (primary) hypertension One time refill while awaiting appointment with PCP  Discussed importance of staying on BP medication and monitoring BP at home   - amLODipine (NORVASC) 5 MG tablet; Take 1 tablet (5 mg total) by mouth daily.  Dispense: 90 tablet; Refill: 0    Patient will call for in person appointment with PCP, will take BP at home and keep log and follow up with any concerns   Follow Up Instructions: I discussed the assessment and treatment plan with the patient. The patient was provided an opportunity to ask questions and all were answered. The patient agreed with the plan and demonstrated an understanding of the instructions.  A copy of instructions were sent to the patient via MyChart unless otherwise noted below.    The patient was advised to call back or seek an in-person evaluation if the symptoms worsen or if the condition fails to improve as anticipated.  Time:  I spent 10 minutes with the patient via telehealth technology discussing the above problems/concerns.    Viviano Simas, FNP

## 2022-06-17 DIAGNOSIS — G4733 Obstructive sleep apnea (adult) (pediatric): Secondary | ICD-10-CM | POA: Diagnosis not present

## 2022-10-05 ENCOUNTER — Encounter: Payer: Self-pay | Admitting: Family

## 2022-10-07 NOTE — Telephone Encounter (Signed)
Schedule appointment?

## 2022-10-28 ENCOUNTER — Ambulatory Visit
Admission: EM | Admit: 2022-10-28 | Discharge: 2022-10-28 | Disposition: A | Discharge status: Home / Self Care | Payer: BC Managed Care – PPO | Attending: Internal Medicine | Admitting: Internal Medicine

## 2022-10-28 DIAGNOSIS — R051 Acute cough: Secondary | ICD-10-CM | POA: Diagnosis not present

## 2022-10-28 DIAGNOSIS — I10 Essential (primary) hypertension: Secondary | ICD-10-CM | POA: Diagnosis not present

## 2022-10-28 DIAGNOSIS — Z1152 Encounter for screening for COVID-19: Secondary | ICD-10-CM | POA: Diagnosis not present

## 2022-10-28 DIAGNOSIS — J069 Acute upper respiratory infection, unspecified: Secondary | ICD-10-CM | POA: Diagnosis not present

## 2022-10-28 LAB — POCT INFLUENZA A/B
Influenza A, POC: NEGATIVE
Influenza B, POC: NEGATIVE

## 2022-10-28 MED ORDER — AMLODIPINE BESYLATE 5 MG PO TABS: 5.0000 mg | ORAL_TABLET | Freq: Every day | ORAL | 0 refills | Status: DC

## 2022-10-28 MED ORDER — BENZONATATE 100 MG PO CAPS: 100.0000 mg | ORAL_CAPSULE | Freq: Three times a day (TID) | ORAL | 0 refills | Status: DC | PRN

## 2022-10-28 MED ORDER — VALSARTAN-HYDROCHLOROTHIAZIDE 320-25 MG PO TABS: 1.0000 | ORAL_TABLET | Freq: Every day | ORAL | 0 refills | Status: DC

## 2022-10-28 MED ORDER — FLUTICASONE PROPIONATE 50 MCG/ACT NA SUSP: 1.0000 | Freq: Every day | NASAL | 0 refills | Status: DC

## 2022-10-28 NOTE — Discharge Instructions (Signed)
Rapid flu was negative.  COVID test pending.  Will call if it is positive.  It appears that you have a viral illness that should run its course and self resolve with the help of symptomatic treatment as we discussed.  I have prescribed 2 medications to alleviate symptoms.  Blood pressure medication has been refilled.  Monitor blood pressure very closely at home.  Follow-up with PCP for further refills and management.

## 2022-10-28 NOTE — ED Triage Notes (Signed)
Pt reports cough,congestion and fatigue x 2 days. Pt reports body aches.

## 2022-10-28 NOTE — ED Provider Notes (Addendum)
[facility]    CSN: [id number] Arrival date & time: 10/28/22  1522      History   Chief Complaint Chief Complaint  Patient presents with   Sore Throat   Cough   Nasal Congestion    HPI Zachary Sharp is a 32 y.o. male.   Patient presents with cough, nasal congestion, fatigue that started 2 days ago.  Patient reports several known sick contacts as his family members have been sick and several coworkers have had similar symptoms.  Patient denies any associated fever at home.  Denies sore throat, chest pain, shortness of breath, nausea, vomiting, diarrhea, abdominal pain.  Patient reports history of asthma but has not had to use albuterol inhaler since symptoms started.  Patient also has elevated blood pressure reading.  Reports that he lost his amlodipine and valsartan HCTZ about 3 months ago.  He has not been monitoring his blood pressure at home.  Denies headache, dizziness, blurred vision.   Sore Throat  Cough   Past Medical History:  Diagnosis Date   Hypertension     Patient Active Problem List   Diagnosis Date Noted   Allergic rhinitis 08/06/2021   Asthma 08/06/2021   Essential (primary) hypertension 08/06/2021   Keloid scar 08/06/2021   Morbid obesity ([place]) 08/06/2021   Knee pain, right 08/03/2012    Past Surgical History:  Procedure Laterality Date   TONSILLECTOMY         Home Medications    Prior to Admission medications   Medication Sig Start Date End Date Taking? Authorizing Provider  benzonatate (TESSALON) 100 MG capsule Take 1 capsule (100 mg total) by mouth every 8 (eight) hours as needed for cough. 10/28/22  Yes Latasha Puskas, Hildred Alamin E, FNP  fluticasone (FLONASE) 50 MCG/ACT nasal spray Place 1 spray into both nostrils daily. 10/28/22  Yes Abdur Hoglund, Michele Rockers, FNP  albuterol (VENTOLIN HFA) 108 (90 Base) MCG/ACT inhaler Inhale 2 puffs into the lungs every 6 (six) hours as needed for wheezing or shortness of breath. 11/22/21   Camillia Herter, NP   amLODipine (NORVASC) 5 MG tablet Take 1 tablet (5 mg total) by mouth daily. 10/28/22 01/26/23  Teodora Medici, FNP  omeprazole (PRILOSEC) 20 MG capsule Take 1 capsule (20 mg total) by mouth daily. With breakfast and dinner while symptoms persist 08/08/21   Camillia Herter, NP  predniSONE (DELTASONE) 20 MG tablet Take 2 tablets (40 mg total) by mouth daily with breakfast. 11/22/21   Camillia Herter, NP  valsartan-hydrochlorothiazide (DIOVAN-HCT) 320-25 MG tablet Take 1 tablet by mouth daily. 10/28/22 01/26/23  Teodora Medici, FNP    Family History Family History  Family history unknown: Yes    Social History Social History   Tobacco Use   Smoking status: Never    Passive exposure: Never   Smokeless tobacco: Never  Vaping Use   Vaping Use: Never used  Substance Use Topics   Alcohol use: No   Drug use: No     Allergies   Patient has no known allergies.   Review of Systems Review of Systems Per HPI  Physical Exam Triage Vital Signs ED Triage Vitals [10/28/22 1540]  Enc Vitals Group     BP (!) 184/124     Pulse Rate 82     Resp 18     Temp 98.9 F (37.2 C)     Temp Source Oral     SpO2 100 %     Weight  Height      Head Circumference      Peak Flow      Pain Score      Pain Loc      Pain Edu?      Excl. in [place]?    No data found.  Updated Vital Signs BP (!) 184/124 (BP Location: Left Arm)   Pulse 82   Temp 98.9 F (37.2 C) (Oral)   Resp 18   SpO2 100%   Visual Acuity Right Eye Distance:   Left Eye Distance:   Bilateral Distance:    Right Eye Near:   Left Eye Near:    Bilateral Near:     Physical Exam Constitutional:      General: He is not in acute distress.    Appearance: Normal appearance. He is not toxic-appearing or diaphoretic.  HENT:     Head: Normocephalic and atraumatic.     Right Ear: Tympanic membrane and ear canal normal.     Left Ear: Tympanic membrane and ear canal normal.     Nose: Congestion present.     Mouth/Throat:      Mouth: Mucous membranes are moist.     Pharynx: Posterior oropharyngeal erythema present.  Eyes:     Extraocular Movements: Extraocular movements intact.     Conjunctiva/sclera: Conjunctivae normal.     Pupils: Pupils are equal, round, and reactive to light.  Cardiovascular:     Rate and Rhythm: Normal rate and regular rhythm.     Pulses: Normal pulses.     Heart sounds: Normal heart sounds.  Pulmonary:     Effort: Pulmonary effort is normal. No respiratory distress.     Breath sounds: Normal breath sounds. No stridor. No wheezing, rhonchi or rales.  Abdominal:     General: Abdomen is flat. Bowel sounds are normal.     Palpations: Abdomen is soft.  Musculoskeletal:        General: Normal range of motion.     Cervical back: Normal range of motion.  Skin:    General: Skin is warm and dry.  Neurological:     General: No focal deficit present.     Mental Status: He is alert and oriented to person, place, and time. Mental status is at baseline.     Cranial Nerves: Cranial nerves 2-12 are intact.     Sensory: Sensation is intact.     Motor: Motor function is intact.     Coordination: Coordination is intact.     Gait: Gait is intact.  Psychiatric:        Mood and Affect: Mood normal.        Behavior: Behavior normal.      UC Treatments / Results  Labs (all labs ordered are listed, but only abnormal results are displayed) Labs Reviewed  SARS CORONAVIRUS 2 (TAT 6-24 HRS)  POCT INFLUENZA A/B    EKG   Radiology No results found.  Procedures Procedures (including critical care time)  Medications Ordered in UC Medications - No data to display  Initial Impression / Assessment and Plan / UC Course  I have reviewed the triage vital signs and the nursing notes.  Pertinent labs & imaging results that were available during my care of the patient were reviewed by me and considered in my medical decision making (see chart for details).     1.  Viral illness Patient presents  with symptoms likely from a viral upper respiratory infection. Do not suspect underlying cardiopulmonary process. Symptoms seem  unlikely related to ACS, CHF or COPD exacerbations, pneumonia, pneumothorax. Patient is nontoxic appearing and not in need of emergent medical intervention.  Rapid flu was negative.  COVID test pending. Recommended symptom control with medications that are safe with hypertension.  Patient was sent prescriptions. Return if symptoms fail to improve in 1-2 weeks or you develop shortness of breath, chest pain, severe headache. Patient states understanding and is agreeable.  2.  Essential hypertension Patient has significantly elevated blood pressure eating in urgent care.  He is asymptomatic regarding blood pressure and neuroexam is normal so do not think that emergent evaluation is necessary at this time.  I do think that restarting blood pressure medication and monitoring blood pressure diligently at home is reasonable at this time.  Advised patient to follow-up with PCP as soon as possible for further refills and blood pressure management.  Advised strict return and ER precautions in regards blood pressure.  Patient verbalized understanding and was agreeable with plan.  Refilled amlodipine and valsartan HCTZ blood pressure medication at previously prescribed dose. Patient declined blood work today.   Discharged with PCP followup.  Final Clinical Impressions(s) / UC Diagnoses   Final diagnoses:  Viral upper respiratory infection  Essential hypertension  Acute cough     Discharge Instructions      Rapid flu was negative.  COVID test pending.  Will call if it is positive.  It appears that you have a viral illness that should run its course and self resolve with the help of symptomatic treatment as we discussed.  I have prescribed 2 medications to alleviate symptoms.  Blood pressure medication has been refilled.  Monitor blood pressure very closely at home.  Follow-up with PCP  for further refills and management.    ED Prescriptions     Medication Sig Dispense Auth. Provider   amLODipine (NORVASC) 5 MG tablet Take 1 tablet (5 mg total) by mouth daily. 90 tablet [place], [place] E, [place]   valsartan-hydrochlorothiazide (DIOVAN-HCT) 320-25 MG tablet Take 1 tablet by mouth daily. 90 tablet [place], [place] E, [place]   benzonatate (TESSALON) 100 MG capsule Take 1 capsule (100 mg total) by mouth every 8 (eight) hours as needed for cough. 21 capsule [place], [place] E, [place]   fluticasone [facility]) 50 MCG/ACT nasal spray Place 1 spray into both nostrils daily. 16 g Teodora Medici, [place]      PDMP not reviewed this encounter.   Teodora Medici, [place] 10/28/22 [place], [place], [place] 10/28/22 [phone]

## 2022-10-29 LAB — SARS CORONAVIRUS 2 (TAT 6-24 HRS): SARS Coronavirus 2: NEGATIVE

## 2022-11-11 NOTE — Progress Notes (Signed)
Patient ID: Zachary Sharp, male    DOB: 1990-12-28  MRN: [id number]  CC: FMLA Paperwork  Subjective: Zachary Sharp is a 32 y.o. male who presents for FMLA paperwork.   His concerns today include:  - Patient presents for FMLA paperwork completion related to recent Urgent Care visit on 10/28/2022. He was seen for blood pressure and a viral upper respiratory infection at that time. 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 Reports his FMLA should last from 10/28/2022 - 10/31/2022. Since Urgent Care visit he is doing well on his blood pressure medications. Home blood pressures similar to today's result. He denies red flag symptoms associated with blood pressure.  - Concern for plantar fasciitis. Standing makes better. Sitting makes worse. He has tried conservative measures such as stretching and ice without relief. He is ready for a referral to Podiatry. - Reports he is currently established with Bariatric Surgery and planning for possible gastric sleeve soon.  - No further issues/concerns for discussion today.   Patient Active Problem List   Diagnosis Date Noted   Allergic rhinitis 08/06/2021   Asthma 08/06/2021   Essential (primary) hypertension 08/06/2021   Keloid scar 08/06/2021   Morbid obesity ([place]) 08/06/2021   Knee pain, right 08/03/2012     Current Outpatient Medications on File Prior to Visit  Medication Sig Dispense Refill   albuterol (VENTOLIN HFA) 108 (90 Base) MCG/ACT inhaler Inhale 2 puffs into the lungs every 6 (six) hours as needed for wheezing or shortness of breath. 8 g 1   amLODipine (NORVASC) 5 MG tablet Take 1 tablet (5 mg total) by mouth daily. 90 tablet 0   fluticasone (FLONASE) 50 MCG/ACT nasal spray Place 1 spray into both nostrils daily. 16 g 0   omeprazole (PRILOSEC) 20 MG capsule Take 1 capsule (20 mg total) by mouth daily. With breakfast and dinner while symptoms persist 30 capsule 0   predniSONE (DELTASONE) 20 MG tablet  Take 2 tablets (40 mg total) by mouth daily with breakfast. 14 tablet 0   valsartan-hydrochlorothiazide (DIOVAN-HCT) 320-25 MG tablet Take 1 tablet by mouth daily. 90 tablet 0   No current facility-administered medications on file prior to visit.    No Known Allergies  Social History   Socioeconomic History   Marital status: Married    Spouse name: Not on file   Number of children: Not on file   Years of education: Not on file   Highest education level: Not on file  Occupational History   Not on file  Tobacco Use   Smoking status: Never    Passive exposure: Never   Smokeless tobacco: Never  Vaping Use   Vaping Use: Never used  Substance and Sexual Activity   Alcohol use: No   Drug use: No   Sexual activity: Yes  Other Topics Concern   Not on file  Social History Narrative   Not on file   Social Determinants of Health   Financial Resource Strain: Not on file  Food Insecurity: Not on file  Transportation Needs: Not on file  Physical Activity: Not on file  Stress: Not on file  Social Connections: Not on file  Intimate Partner Violence: Not on file    Family History  Family history unknown: Yes    Past Surgical History:  Procedure Laterality Date   TONSILLECTOMY      ROS: Review of Systems Negative except as stated above  PHYSICAL EXAM: BP (!) 142/89  Pulse (!) 112   Temp 98.3 F (36.8 C)   Resp 16   Ht 5' 8.5" (1.74 m)   Wt (!) 380 lb (172.4 kg)   BMI 56.93 kg/m   Physical Exam HENT:     Head: Normocephalic and atraumatic.  Eyes:     Extraocular Movements: Extraocular movements intact.     Conjunctiva/sclera: Conjunctivae normal.     Pupils: Pupils are equal, round, and reactive to light.  Cardiovascular:     Rate and Rhythm: Normal rate and regular rhythm.     Pulses: Normal pulses.     Heart sounds: Normal heart sounds.  Pulmonary:     Effort: Pulmonary effort is normal.     Breath sounds: Normal breath sounds.  Musculoskeletal:      Cervical back: Normal range of motion and neck supple.  Neurological:     General: No focal deficit present.     Mental Status: He is alert and oriented to person, place, and time.  Psychiatric:        Mood and Affect: Mood normal.        Behavior: Behavior normal.      ASSESSMENT AND PLAN: 1. Encounter for completion of form with patient 2. Primary hypertension - FMLA from [employer] completed today in office.  - Blood pressure not at goal during today's visit. Patient asymptomatic without chest pressure, chest pain, palpitations, shortness of breath, worst headache of life, and any additional red flag symptoms. - Continue Valsartan-Hydrochlorothiazide as prescribed. No refills needed as of present.  - Increase Amlodipine from 5 mg daily to 10 mg daily. Patient to take two 5 mg tablets to equal increased dose. No refills needed as of present.  - Counseled on blood pressure goal of less than 130/80, low-sodium, DASH diet, medication compliance, and 150 minutes of moderate intensity exercise per week as tolerated. Counseled on medication adherence and adverse effects. - Follow-up with primary provider in 4 weeks or sooner if needed for blood pressure check.   3. Plantar fasciitis - Referral to Podiatry for further evaluation/management.  - Ambulatory referral to Podiatry  Patient was given the opportunity to ask questions.  Patient verbalized understanding of the plan and was able to repeat key elements of the plan. Patient was given clear instructions to go to Emergency Department or return to medical center if symptoms don't improve, worsen, or new problems develop.The patient verbalized understanding.   Orders Placed This Encounter  Procedures   Ambulatory referral to Podiatry    Follow-up with primary provider as scheduled.  Camillia Herter, NP

## 2022-11-18 ENCOUNTER — Ambulatory Visit (INDEPENDENT_AMBULATORY_CARE_PROVIDER_SITE_OTHER): Payer: BC Managed Care – PPO | Admitting: Family

## 2022-11-18 ENCOUNTER — Encounter: Payer: Self-pay | Admitting: Family

## 2022-11-18 VITALS — BP 142/89 | HR 112 | Temp 98.3°F | Resp 16 | Ht 68.5 in | Wt 380.0 lb

## 2022-11-18 DIAGNOSIS — M722 Plantar fascial fibromatosis: Secondary | ICD-10-CM | POA: Diagnosis not present

## 2022-11-18 DIAGNOSIS — I10 Essential (primary) hypertension: Secondary | ICD-10-CM | POA: Diagnosis not present

## 2022-11-18 DIAGNOSIS — Z0289 Encounter for other administrative examinations: Secondary | ICD-10-CM | POA: Diagnosis not present

## 2022-11-18 NOTE — Progress Notes (Signed)
Pt presents for completion of FMLA -needs to be covered from 02/20-02/22 back to work 02/23

## 2022-11-24 ENCOUNTER — Encounter: Payer: Self-pay | Admitting: Family

## 2022-11-25 NOTE — Telephone Encounter (Signed)
I will review and make revisions if appropriate once I have paperwork in hand to review.

## 2022-11-25 NOTE — Telephone Encounter (Signed)
Form updates and faxed

## 2023-03-06 ENCOUNTER — Telehealth: Payer: BC Managed Care – PPO | Admitting: Family Medicine

## 2023-03-06 DIAGNOSIS — R0982 Postnasal drip: Secondary | ICD-10-CM | POA: Diagnosis not present

## 2023-03-06 DIAGNOSIS — K219 Gastro-esophageal reflux disease without esophagitis: Secondary | ICD-10-CM

## 2023-03-06 MED ORDER — FLUTICASONE PROPIONATE 50 MCG/ACT NA SUSP: 2.0000 | Freq: Every day | NASAL | 0 refills | Status: DC

## 2023-03-06 MED ORDER — OMEPRAZOLE 20 MG PO CPDR: 20.0000 mg | DELAYED_RELEASE_CAPSULE | Freq: Two times a day (BID) | ORAL | 0 refills | Status: DC

## 2023-03-06 MED ORDER — SUCRALFATE 1 GM/10ML PO SUSP: 1.0000 g | Freq: Two times a day (BID) | ORAL | 0 refills | Status: DC

## 2023-03-06 NOTE — Patient Instructions (Signed)
Zachary Sharp, thank you for joining Freddy Finner, NP for today's virtual visit.  While this provider is not your primary care provider (PCP), if your PCP is located in our provider database this encounter information will be shared with them immediately following your visit.   A [facility] MyChart account gives you access to today's visit and all your visits, tests, and labs performed at [facility] " click here if you don't have a [facility] MyChart account or go to [email or web address]  Consent: (Patient) Zachary Sharp provided verbal consent for this virtual visit at the beginning of the encounter.  Current Medications:  Current Outpatient Medications:    fluticasone (FLONASE) 50 MCG/ACT nasal spray, Place 2 sprays into both nostrils daily., Disp: 16 g, Rfl: 0   omeprazole (PRILOSEC) 20 MG capsule, Take 1 capsule (20 mg total) by mouth 2 (two) times daily before a meal., Disp: 60 capsule, Rfl: 0   sucralfate (CARAFATE) 1 GM/10ML suspension, Take 10 mLs (1 g total) by mouth 2 (two) times daily before a meal., Disp: 414 mL, Rfl: 0   albuterol (VENTOLIN HFA) 108 (90 Base) MCG/ACT inhaler, Inhale 2 puffs into the lungs every 6 (six) hours as needed for wheezing or shortness of breath., Disp: 8 g, Rfl: 1   amLODipine (NORVASC) 5 MG tablet, Take 1 tablet (5 mg total) by mouth daily., Disp: 90 tablet, Rfl: 0   predniSONE (DELTASONE) 20 MG tablet, Take 2 tablets (40 mg total) by mouth daily with breakfast., Disp: 14 tablet, Rfl: 0   valsartan-hydrochlorothiazide (DIOVAN-HCT) 320-25 MG tablet, Take 1 tablet by mouth daily., Disp: 90 tablet, Rfl: 0   Medications ordered in this encounter:  Meds ordered this encounter  Medications   omeprazole (PRILOSEC) 20 MG capsule    Sig: Take 1 capsule (20 mg total) by mouth 2 (two) times daily before a meal.    Dispense:  60 capsule    Refill:  0    Order Specific Question:   Supervising Provider    Answer:   Merrilee Jansky  [[id number]]   sucralfate (CARAFATE) 1 GM/10ML suspension    Sig: Take 10 mLs (1 g total) by mouth 2 (two) times daily before a meal.    Dispense:  414 mL    Refill:  0    Order Specific Question:   Supervising Provider    Answer:   Merrilee Jansky [[id number]]   fluticasone (FLONASE) 50 MCG/ACT nasal spray    Sig: Place 2 sprays into both nostrils daily.    Dispense:  16 g    Refill:  0    Order Specific Question:   Supervising Provider    Answer:   Merrilee Jansky [id number]     *If you need refills on other medications prior to your next appointment, please contact your pharmacy*  Follow-Up: Call back or seek an in-person evaluation if the symptoms worsen or if the condition fails to improve as anticipated.  [facility] [phone]  given inability to eat well will start with these meds to see if we can get reflux symptoms under control -advised follow up in person if note better after weekend or seeing improvement -advised follow up with PCP as well for on going care and referrals if needed -bland diet and hydration   Other Instructions    If you have been instructed to have an in-person evaluation today at a local Urgent Care facility, please use the link below. It will take  you to a list of all of our available [facility], including address, phone number and hours of operation. Please do not delay care.  [facility]  If you or a family member do not have a primary care provider, use the link below to schedule a visit and establish care. When you choose a [facility] primary care physician or advanced practice provider, you gain a long-term partner in health. Find a Primary Care Provider  Learn more about [facility] in-office and virtual care options: [facility]

## 2023-03-06 NOTE — Progress Notes (Signed)
Virtual Visit Consent   Zachary Sharp, you are scheduled for a virtual visit with a [facility] provider today. Just as with appointments in the office, your consent must be obtained to participate. Your consent will be active for this visit and any virtual visit you may have with one of our providers in the next 365 days. If you have a MyChart account, a copy of this consent can be sent to you electronically.  As this is a virtual visit, video technology does not allow for your provider to perform a traditional examination. This may limit your provider's ability to fully assess your condition. If your provider identifies any concerns that need to be evaluated in person or the need to arrange testing (such as labs, EKG, etc.), we will make arrangements to do so. Although advances in technology are sophisticated, we cannot ensure that it will always work on either your end or our end. If the connection with a video visit is poor, the visit may have to be switched to a telephone visit. With either a video or telephone visit, we are not always able to ensure that we have a secure connection.  By engaging in this virtual visit, you consent to the provision of healthcare and authorize for your insurance to be billed (if applicable) for the services provided during this visit. Depending on your insurance coverage, you may receive a charge related to this service.  I need to obtain your verbal consent now. Are you willing to proceed with your visit today? Zachary Sharp has provided verbal consent on 03/06/2023 for a virtual visit (video or telephone). Zachary Finner, NP  Date: 03/06/2023 10:03 AM  Virtual Visit via Video Note   I, Zachary Sharp, connected with  Zachary Sharp  ([id number], 1991-01-22) on 03/06/23 at 10:00 AM EDT by a video-enabled telemedicine application and verified that I am speaking with the correct person using two identifiers.  Location: Patient: Virtual Visit Location Patient:  Home Provider: Virtual Visit Location Provider: Home Office   I discussed the limitations of evaluation and management by telemedicine and the availability of in person appointments. The patient expressed understanding and agreed to proceed.    History of Present Illness: Zachary Sharp is a 32 y.o. who identifies as a male who was assigned male at birth, and is being seen today for congestion and vomiting- with reflux  Onset was over the last week or so  Associated symptoms are post nasal drip, inability to tolerate solids, reflux, and cough with clear mucus Modifying factors are nothing at this time Denies chest pain, shortness of breath, fevers, chills, or pain, nausea, or other GI symptoms, no blood in vomiting or stool, denies feeling a lump sensation in throat.    Problems:  Patient Active Problem List   Diagnosis Date Noted   Allergic rhinitis 08/06/2021   Asthma 08/06/2021   Essential (primary) hypertension 08/06/2021   Keloid scar 08/06/2021   Morbid obesity ([facility]) 08/06/2021   Knee pain, right 08/03/2012    Allergies: No Known Allergies Medications:  Current Outpatient Medications:    albuterol (VENTOLIN HFA) 108 (90 Base) MCG/ACT inhaler, Inhale 2 puffs into the lungs every 6 (six) hours as needed for wheezing or shortness of breath., Disp: 8 g, Rfl: 1   amLODipine (NORVASC) 5 MG tablet, Take 1 tablet (5 mg total) by mouth daily., Disp: 90 tablet, Rfl: 0   fluticasone (FLONASE) 50 MCG/ACT nasal spray, Place 1 spray into both nostrils daily., Disp: 16 g,  Rfl: 0   omeprazole (PRILOSEC) 20 MG capsule, Take 1 capsule (20 mg total) by mouth daily. With breakfast and dinner while symptoms persist, Disp: 30 capsule, Rfl: 0   predniSONE (DELTASONE) 20 MG tablet, Take 2 tablets (40 mg total) by mouth daily with breakfast., Disp: 14 tablet, Rfl: 0   valsartan-hydrochlorothiazide (DIOVAN-HCT) 320-25 MG tablet, Take 1 tablet by mouth daily., Disp: 90 tablet, Rfl:  0  Observations/Objective: Patient is well-developed, well-nourished in no acute distress.  Resting comfortably  at home.  Head is normocephalic, atraumatic.  No labored breathing.  Speech is clear and coherent with logical content.  Patient is alert and oriented at baseline.    Assessment and Plan:  1. Gastroesophageal reflux disease, unspecified whether esophagitis present  - omeprazole (PRILOSEC) 20 MG capsule; Take 1 capsule (20 mg total) by mouth 2 (two) times daily before a meal.  Dispense: 60 capsule; Refill: 0 - sucralfate (CARAFATE) 1 GM/10ML suspension; Take 10 mLs (1 g total) by mouth 2 (two) times daily before a meal.  Dispense: 414 mL; Refill: 0  2. Post-nasal drip  - fluticasone (FLONASE) 50 MCG/ACT nasal spray; Place 2 sprays into both nostrils daily.  Dispense: 16 g; Refill: 0   -etiology unclear- given inability to eat well will start with GERD meds and add PND medication  -advised follow up in person if note better after weekend or seeing improvement -advised follow up with PCP as well for on going care and referrals if needed -bland diet and hydration  Reviewed side effects, risks and benefits of medication.    Patient acknowledged agreement and understanding of the plan.   Past Medical, Surgical, Social History, Allergies, and Medications have been Reviewed.     Follow Up Instructions: I discussed the assessment and treatment plan with the patient. The patient was provided an opportunity to ask questions and all were answered. The patient agreed with the plan and demonstrated an understanding of the instructions.  A copy of instructions were sent to the patient via MyChart unless otherwise noted below.     The patient was advised to call back or seek an in-person evaluation if the symptoms worsen or if the condition fails to improve as anticipated.  Time:  I spent 10 minutes with the patient via telehealth technology discussing the above  problems/concerns.    Zachary Finner, NP

## 2023-03-12 ENCOUNTER — Emergency Department (HOSPITAL_COMMUNITY)
Admission: EM | Admit: 2023-03-12 | Discharge: 2023-03-12 | Disposition: A | Discharge status: Home / Self Care | Payer: BC Managed Care – PPO | Attending: Emergency Medicine | Admitting: Emergency Medicine

## 2023-03-12 ENCOUNTER — Other Ambulatory Visit: Payer: Self-pay

## 2023-03-12 ENCOUNTER — Encounter (HOSPITAL_COMMUNITY): Payer: Self-pay

## 2023-03-12 ENCOUNTER — Emergency Department (HOSPITAL_COMMUNITY): Payer: BC Managed Care – PPO

## 2023-03-12 DIAGNOSIS — J45909 Unspecified asthma, uncomplicated: Secondary | ICD-10-CM | POA: Insufficient documentation

## 2023-03-12 DIAGNOSIS — R1084 Generalized abdominal pain: Secondary | ICD-10-CM | POA: Diagnosis not present

## 2023-03-12 DIAGNOSIS — R531 Weakness: Secondary | ICD-10-CM | POA: Insufficient documentation

## 2023-03-12 DIAGNOSIS — I10 Essential (primary) hypertension: Secondary | ICD-10-CM | POA: Insufficient documentation

## 2023-03-12 DIAGNOSIS — R509 Fever, unspecified: Secondary | ICD-10-CM | POA: Insufficient documentation

## 2023-03-12 DIAGNOSIS — R000 Tachycardia, unspecified: Secondary | ICD-10-CM | POA: Diagnosis not present

## 2023-03-12 DIAGNOSIS — A419 Sepsis, unspecified organism: Secondary | ICD-10-CM | POA: Diagnosis not present

## 2023-03-12 DIAGNOSIS — R0989 Other specified symptoms and signs involving the circulatory and respiratory systems: Secondary | ICD-10-CM | POA: Diagnosis not present

## 2023-03-12 DIAGNOSIS — R1010 Upper abdominal pain, unspecified: Secondary | ICD-10-CM | POA: Diagnosis not present

## 2023-03-12 LAB — CBC WITH DIFFERENTIAL/PLATELET
Abs Immature Granulocytes: 0.02 10*3/uL (ref 0.00–0.07)
Basophils Absolute: 0 10*3/uL (ref 0.0–0.1)
Basophils Relative: 1 %
Eosinophils Absolute: 0 10*3/uL (ref 0.0–0.5)
Eosinophils Relative: 0 %
HCT: 43.6 % (ref 39.0–52.0)
Hemoglobin: 14.2 g/dL (ref 13.0–17.0)
Immature Granulocytes: 1 %
Lymphocytes Relative: 29 %
Lymphs Abs: 1.3 10*3/uL (ref 0.7–4.0)
MCH: 26.7 pg (ref 26.0–34.0)
MCHC: 32.6 g/dL (ref 30.0–36.0)
MCV: 82.1 fL (ref 80.0–100.0)
Monocytes Absolute: 0.5 10*3/uL (ref 0.1–1.0)
Monocytes Relative: 12 %
Neutro Abs: 2.5 10*3/uL (ref 1.7–7.7)
Neutrophils Relative %: 57 %
Platelets: 300 10*3/uL (ref 150–400)
RBC: 5.31 MIL/uL (ref 4.22–5.81)
RDW: 12.3 % (ref 11.5–15.5)
WBC: 4.3 10*3/uL (ref 4.0–10.5)
nRBC: 0 % (ref 0.0–0.2)

## 2023-03-12 LAB — COMPREHENSIVE METABOLIC PANEL
ALT: 44 U/L (ref 0–44)
AST: 35 U/L (ref 15–41)
Albumin: 3.7 g/dL (ref 3.5–5.0)
Alkaline Phosphatase: 43 U/L (ref 38–126)
Anion gap: 10 (ref 5–15)
BUN: 9 mg/dL (ref 6–20)
CO2: 20 mmol/L — ABNORMAL LOW (ref 22–32)
Calcium: 8.8 mg/dL — ABNORMAL LOW (ref 8.9–10.3)
Chloride: 103 mmol/L (ref 98–111)
Creatinine, Ser: 0.82 mg/dL (ref 0.61–1.24)
GFR, Estimated: >60 mL/min (ref >60)
Glucose, Bld: 114 mg/dL — ABNORMAL HIGH (ref 70–99)
Potassium: 3.9 mmol/L (ref 3.5–5.1)
Sodium: 133 mmol/L — ABNORMAL LOW (ref 135–145)
Total Bilirubin: 1 mg/dL (ref 0.3–1.2)
Total Protein: 7.1 g/dL (ref 6.5–8.1)

## 2023-03-12 LAB — LIPASE, BLOOD: Lipase: 42 U/L (ref 11–51)

## 2023-03-12 LAB — LACTIC ACID, PLASMA: Lactic Acid, Venous: 1.2 mmol/L (ref 0.5–1.9)

## 2023-03-12 MED ORDER — LACTATED RINGERS IV BOLUS (SEPSIS)
1000.0000 mL | Freq: Once | INTRAVENOUS | Status: AC
  Administered 2023-03-12: 1000 mL via INTRAVENOUS

## 2023-03-12 MED ORDER — FAMOTIDINE IN NACL 20-0.9 MG/50ML-% IV SOLN
20.0000 mg | Freq: Once | INTRAVENOUS | Status: AC
  Administered 2023-03-12: 20 mg via INTRAVENOUS
  Filled 2023-03-12: qty 50

## 2023-03-12 MED ORDER — PANTOPRAZOLE SODIUM 40 MG IV SOLR
40.0000 mg | Freq: Once | INTRAVENOUS | Status: AC
  Administered 2023-03-12: 40 mg via INTRAVENOUS
  Filled 2023-03-12: qty 10

## 2023-03-12 MED ORDER — ACETAMINOPHEN 325 MG PO TABS
650.0000 mg | ORAL_TABLET | Freq: Once | ORAL | Status: AC
  Administered 2023-03-12: 650 mg via ORAL
  Filled 2023-03-12: qty 2

## 2023-03-12 NOTE — Discharge Instructions (Addendum)
Your evaluated today for weakness.  You were running a fever.  You were given fluids and Tylenol.  I recommend following up with your primary care provider for further evaluation of your chronic congestion.  You may try over-the-counter Nexium for 2 weeks.  I also recommend trying over-the-counter Pepcid for the next 2 to 4 weeks to see if it helps with your symptoms.  If you develop any life-threatening symptoms please return to the emergency department.

## 2023-03-12 NOTE — ED Triage Notes (Signed)
Pt BIB EMS from work for abdominal pain. During his lunch break he was trying to eat and couldn't hold anything down. Pt has had abdominal pain on and off for two weeks. Pt tried to manage at home but did not have any relief.   142/96 116 hr Cbg 169

## 2023-03-12 NOTE — ED Provider Notes (Signed)
[place] EMERGENCY DEPARTMENT AT [facility] Provider Note   CSN: [id number] Arrival date & time: 03/12/23  0425     History  Chief Complaint  Patient presents with   Abdominal Pain    Zachary Sharp is a 32 y.o. male.  Patient presents to the emergency department complaining of generalized weakness.  Patient works at [employer] and began to feel weak..  Workers assisted the patient to a chair as he appeared unsteady on his feet.  Upon EMS arrival the patient appeared to have dry mucous membranes and was tachycardic with a rate of 130.  EMS administered 500 mL of lactated Ringer's during transport.  Upon arrival at the emergency department patient was tachycardic with a rate of 118.  Patient was also found to be febrile with a temperature of 100.5 F.  Patient states he has a feeling of mucus in his throat for weeks with frequent cough.  He states sometimes this feels like it builds up he attempted to throw up.  He denies any vomiting today and states that he has no abdominal pain at this time.  He was unaware that he was febrile.  Patient denies chest pain, shortness of breath, urinary symptoms.  Past medical history significant hypertension, allergic rhinitis, asthma, obesity  HPI     Home Medications Prior to Admission medications   Medication Sig Start Date End Date Taking? Authorizing Provider  albuterol (VENTOLIN HFA) 108 (90 Base) MCG/ACT inhaler Inhale 2 puffs into the lungs every 6 (six) hours as needed for wheezing or shortness of breath. 11/22/21   Rema Fendt, NP  amLODipine (NORVASC) 5 MG tablet Take 1 tablet (5 mg total) by mouth daily. 10/28/22 01/26/23  Gustavus Bryant, FNP  fluticasone (FLONASE) 50 MCG/ACT nasal spray Place 2 sprays into both nostrils daily. 03/06/23   Freddy Finner, NP  omeprazole (PRILOSEC) 20 MG capsule Take 1 capsule (20 mg total) by mouth 2 (two) times daily before a meal. 03/06/23   Freddy Finner, NP  predniSONE (DELTASONE) 20 MG  tablet Take 2 tablets (40 mg total) by mouth daily with breakfast. 11/22/21   Rema Fendt, NP  sucralfate (CARAFATE) 1 GM/10ML suspension Take 10 mLs (1 g total) by mouth 2 (two) times daily before a meal. 03/06/23   Freddy Finner, NP  valsartan-hydrochlorothiazide (DIOVAN-HCT) 320-25 MG tablet Take 1 tablet by mouth daily. 10/28/22 01/26/23  Gustavus Bryant, FNP      Allergies    Patient has no known allergies.    Review of Systems   Review of Systems  Physical Exam Updated Vital Signs BP (!) 152/110 (BP Location: Left Arm)   Pulse (!) 118   Temp 99.6 F (37.6 C) (Oral)   Resp 18   Ht 5\' 10"  (1.778 m)   Wt (!) 163.3 kg   SpO2 95%   BMI 51.65 kg/m  Physical Exam Vitals and nursing note reviewed.  Constitutional:      General: He is not in acute distress.    Appearance: He is well-developed. He is obese.  HENT:     Head: Normocephalic and atraumatic.  Eyes:     Conjunctiva/sclera: Conjunctivae normal.  Cardiovascular:     Rate and Rhythm: Regular rhythm. Tachycardia present.     Heart sounds: No murmur heard. Pulmonary:     Effort: Pulmonary effort is normal. No respiratory distress.     Breath sounds: Normal breath sounds.  Abdominal:     Palpations: Abdomen  is soft.     Tenderness: There is no abdominal tenderness.  Musculoskeletal:        General: No swelling.     Cervical back: Neck supple.  Skin:    General: Skin is warm and dry.     Capillary Refill: Capillary refill takes less than 2 seconds.  Neurological:     Mental Status: He is alert.  Psychiatric:        Mood and Affect: Mood normal.     ED Results / Procedures / Treatments   Labs (all labs ordered are listed, but only abnormal results are displayed) Labs Reviewed  COMPREHENSIVE METABOLIC PANEL - Abnormal; Notable for the following components:      Result Value   Sodium 133 (*)    CO2 20 (*)    Glucose, Bld 114 (*)    Calcium 8.8 (*)    All other components within normal limits  CULTURE,  BLOOD (ROUTINE X 2)  CULTURE, BLOOD (ROUTINE X 2)  LACTIC ACID, PLASMA  CBC WITH DIFFERENTIAL/PLATELET  LIPASE, BLOOD  URINALYSIS, ROUTINE W REFLEX MICROSCOPIC    EKG None  Radiology DG Chest Port 1 View  Result Date: 03/12/2023 CLINICAL DATA:  Sepsis.  Upper abdominal pain for several weeks. EXAM: PORTABLE CHEST 1 VIEW COMPARISON:  11/22/2021 FINDINGS: The heart size and mediastinal contours are within normal limits. The lungs are hypoinflated but clear. The visualized skeletal structures are unremarkable. IMPRESSION: Low lung volumes. No active disease. Electronically Signed   By: Signa Kell M.D.   On: 03/12/2023 05:18    Procedures Procedures    Medications Ordered in ED Medications  lactated ringers bolus 1,000 mL (1,000 mLs Intravenous New Bag/Given 03/12/23 0518)  famotidine (PEPCID) IVPB 20 mg premix (0 mg Intravenous Stopped 03/12/23 0607)  pantoprazole (PROTONIX) injection 40 mg (40 mg Intravenous Given 03/12/23 0518)  acetaminophen (TYLENOL) tablet 650 mg (650 mg Oral Given 03/12/23 0605)    ED Course/ Medical Decision Making/ A&P                             Medical Decision Making Amount and/or Complexity of Data Reviewed Labs: ordered. Radiology: ordered. ECG/medicine tests: ordered.  Risk Prescription drug management.   This patient presents to the ED for concern of weakness, this involves an extensive number of treatment options, and is a complaint that carries with it a high risk of complications and morbidity.  The differential diagnosis includes infection (sepsis), dehydration, dysrhythmia, metabolic abnormality, others   Co morbidities that complicate the patient evaluation  Obesity   Additional history obtained:  Additional history obtained from EMS External records from outside source obtained and reviewed including telehealth notes   Lab Tests:  I Ordered, and personally interpreted labs.  The pertinent results include: Grossly unremarkable  CBC, CMP, lactic, lipase   Imaging Studies ordered:  I ordered imaging studies including chest x-ray I independently visualized and interpreted imaging which showed no active disease I agree with the radiologist interpretation   Cardiac Monitoring: / EKG:  The patient was maintained on a cardiac monitor.  I personally viewed and interpreted the cardiac monitored which showed an underlying rhythm of: Sinus tachycardia   Problem List / ED Course / Critical interventions / Medication management   I ordered medication including Pepcid, Protonix for reflux-like symptoms, Tylenol for fever, lactated Ringer's bolus for fluid resuscitation Reevaluation of the patient after these medicines showed that the patient improved I have  reviewed the patients home medicines and have made adjustments as needed   Test / Admission - Considered:  Patient feels much better after fluid administration, H2 blocker, PPI, and Tylenol.  Question if patient may have some sort of viral infection with his low-grade fever.  Tachycardia has improved significantly.  Patient currently has a heart rate of 104.  Temperature improved to 99.6.  Unclear etiology of patient's chronic congestion but plan to have patient follow-up with primary care.  See no indication at this time for admission.  Plan to discharge home.         Final Clinical Impression(s) / ED Diagnoses Final diagnoses:  Weakness  Fever, unspecified fever cause    Rx / DC Orders ED Discharge Orders     None         Pamala Duffel 03/12/23 4098    Melene Plan, DO 03/12/23 [phone]

## 2023-03-12 NOTE — ED Notes (Signed)
Pt called requesting a work not. One typed and he will make a copy from MyChart.

## 2023-03-13 LAB — BLOOD CULTURE ID PANEL (REFLEXED) - BCID2

## 2023-03-13 NOTE — ED Provider Notes (Signed)
I was presented with positive blood cultures reported.  Review shows that one of the blood cultures is positive for Staphylococcus PACs, negative for Staph aureus and Staph epidermidis.  Phone call with the patient shows that he is feeling significantly improved.  No further documented fever, no new complaints.  Consulted with infectious disease, Dr. Daiva Eves.  We believe that this is most likely a contaminant, especially in light of the patient feeling improved.  However if he was to worsen or represent we would recommend more aggressive evaluation and imaging.   Zachary Logan, DO 03/13/23 1445

## 2023-03-14 LAB — CULTURE, BLOOD (ROUTINE X 2): Special Requests: ADEQUATE

## 2023-03-15 ENCOUNTER — Telehealth (HOSPITAL_BASED_OUTPATIENT_CLINIC_OR_DEPARTMENT_OTHER): Payer: Self-pay | Admitting: *Deleted

## 2023-03-15 NOTE — Telephone Encounter (Signed)
Post ED Visit - Positive Culture Follow-up  Culture report reviewed by antimicrobial stewardship pharmacist: [facility] []  Enzo Bi, Pharm.D. []  Celedonio Miyamoto, Pharm.D., BCPS AQ-ID []  Garvin Fila, Pharm.D., BCPS []  Georgina Pillion, Pharm.D., BCPS []  [place], [address].D., BCPS, AAHIVP []  Estella Husk, Pharm.D., BCPS, AAHIVP []  Lysle Pearl, PharmD, BCPS []  Phillips Climes, PharmD, BCPS []  Agapito Games, PharmD, BCPS []  Verlan Friends, PharmD []  Mervyn Gay, PharmD, BCPS []  Vinnie Level, PharmD  [facility] []  Len Childs, PharmD [x]  Greer Pickerel, PharmD []  Adalberto Cole, PharmD []  Perlie Gold, Rph []  Lonell Face) Jean Rosenthal, PharmD []  Earl Many, PharmD []  Junita Push, PharmD []  Dorna Leitz, PharmD []  Terrilee Files, PharmD []  Lynann Beaver, PharmD []  Keturah Barre, PharmD []  Loralee Pacas, PharmD []  Bernadene Person, PharmD   Positive blood culture Dr. Wilkie Aye addressed/ spoke to Dr. Daiva Eves already. Most likely a contaminent. No further action needed  Patsey Berthold 03/15/2023, 9:18 AM

## 2023-03-17 LAB — CULTURE, BLOOD (ROUTINE X 2)
Culture: NO GROWTH
Special Requests: ADEQUATE

## 2023-03-18 ENCOUNTER — Encounter: Payer: Self-pay | Admitting: Family

## 2023-03-18 ENCOUNTER — Ambulatory Visit (INDEPENDENT_AMBULATORY_CARE_PROVIDER_SITE_OTHER): Payer: BC Managed Care – PPO | Admitting: Family

## 2023-03-18 VITALS — BP 139/103 | HR 120 | Temp 100.3°F | Ht 70.0 in | Wt 345.8 lb

## 2023-03-18 DIAGNOSIS — R000 Tachycardia, unspecified: Secondary | ICD-10-CM | POA: Diagnosis not present

## 2023-03-18 DIAGNOSIS — I10 Essential (primary) hypertension: Secondary | ICD-10-CM | POA: Diagnosis not present

## 2023-03-18 DIAGNOSIS — R531 Weakness: Secondary | ICD-10-CM

## 2023-03-18 DIAGNOSIS — J392 Other diseases of pharynx: Secondary | ICD-10-CM

## 2023-03-18 DIAGNOSIS — K219 Gastro-esophageal reflux disease without esophagitis: Secondary | ICD-10-CM

## 2023-03-18 DIAGNOSIS — R509 Fever, unspecified: Secondary | ICD-10-CM

## 2023-03-18 MED ORDER — AMLODIPINE BESYLATE 5 MG PO TABS
5.0000 mg | ORAL_TABLET | Freq: Every day | ORAL | 0 refills | Status: DC
Start: 2023-03-18 — End: 2023-06-12

## 2023-03-18 MED ORDER — HYDROCHLOROTHIAZIDE 12.5 MG PO TABS
12.5000 mg | ORAL_TABLET | Freq: Every day | ORAL | 0 refills | Status: DC
Start: 2023-03-18 — End: 2023-06-12

## 2023-03-18 NOTE — Progress Notes (Signed)
Pt was in ED and Dr sort of told hi it was acid reflex related. He is on medicine for it and the episodes flare up.   Pt states he might be having signs of carpal tunnel.

## 2023-03-18 NOTE — Progress Notes (Signed)
Patient ID: Zachary Sharp, male    DOB: 04-May-1991  MRN: [id number]  CC: Emergency Department Follow-Up  Subjective: Zachary Sharp is a 32 y.o. male who presents for Emergency Department follow-up.   His concerns today include: 03/12/2023 [facility] at [facility] per DO note: I was presented with positive blood cultures reported.  Review shows that one of the blood cultures is positive for Staphylococcus PACs, negative for Staph aureus and Staph epidermidis.   Phone call with the patient shows that he is feeling significantly improved.  No further documented fever, no new complaints.   Consulted with infectious disease, Dr. Daiva Eves.  We believe that this is most likely a contaminant, especially in light of the patient feeling improved.  However if he was to worsen or represent we would recommend more aggressive evaluation and imaging.                          Medical Decision Making Amount and/or Complexity of Data Reviewed Labs: ordered. Radiology: ordered. ECG/medicine tests: ordered.   Risk Prescription drug management.     This patient presents to the ED for concern of weakness, this involves an extensive number of treatment options, and is a complaint that carries with it a high risk of complications and morbidity.  The differential diagnosis includes infection (sepsis), dehydration, dysrhythmia, metabolic abnormality, others     Co morbidities that complicate the patient evaluation   Obesity     Additional history obtained:   Additional history obtained from EMS External records from outside source obtained and reviewed including telehealth notes     Lab Tests:   I Ordered, and personally interpreted labs.  The pertinent results include: Grossly unremarkable CBC, CMP, lactic, lipase     Imaging Studies ordered:   I ordered imaging studies including chest x-ray I independently visualized and interpreted imaging which showed  no active disease I agree with the radiologist interpretation     Cardiac Monitoring: / EKG:   The patient was maintained on a cardiac monitor.  I personally viewed and interpreted the cardiac monitored which showed an underlying rhythm of: Sinus tachycardia     Problem List / ED Course / Critical interventions / Medication management     I ordered medication including Pepcid, Protonix for reflux-like symptoms, Tylenol for fever, lactated Ringer's bolus for fluid resuscitation Reevaluation of the patient after these medicines showed that the patient improved I have reviewed the patients home medicines and have made adjustments as needed     Test / Admission - Considered:   Patient feels much better after fluid administration, H2 blocker, PPI, and Tylenol.  Question if patient may have some sort of viral infection with his low-grade fever.  Tachycardia has improved significantly.  Patient currently has a heart rate of 104.  Temperature improved to 99.6.  Unclear etiology of patient's chronic congestion but plan to have patient follow-up with primary care.  See no indication at this time for admission.  Plan to discharge home.    Today's visit 03/18/2023: - Feeling overall improved since Emergency Department visit. Taking medications as prescribed. Reports he did have one episode of acid reflux-related issues earlier this morning when he ate lunch at work at 0200. States emergency department doctor told him that he has "silent acid reflux" and needs to be seen by ENT. Patient reports he does have history of acid reflux as well. However, reports this time his  symptoms doesn't present as typical acid reflux and currently having only increased mucus in throat.  - Has not taken blood pressure medications in several months due to Valsartan caused him to feel unwell. He does not complain of red flag symptoms such as but not limited to chest pain, shortness of breath, worst headache of life,  nausea/vomiting.       Patient Active Problem List   Diagnosis Date Noted   Allergic rhinitis 08/06/2021   Asthma 08/06/2021   Essential (primary) hypertension 08/06/2021   Keloid scar 08/06/2021   Morbid obesity (HCC) 08/06/2021   Knee pain, right 08/03/2012     Current Outpatient Medications on File Prior to Visit  Medication Sig Dispense Refill   albuterol (VENTOLIN HFA) 108 (90 Base) MCG/ACT inhaler Inhale 2 puffs into the lungs every 6 (six) hours as needed for wheezing or shortness of breath. (Patient not taking: Reported on 03/18/2023) 8 g 1   fluticasone (FLONASE) 50 MCG/ACT nasal spray Place 2 sprays into both nostrils daily. (Patient not taking: Reported on 03/18/2023) 16 g 0   omeprazole (PRILOSEC) 20 MG capsule Take 1 capsule (20 mg total) by mouth 2 (two) times daily before a meal. (Patient not taking: Reported on 03/18/2023) 60 capsule 0   predniSONE (DELTASONE) 20 MG tablet Take 2 tablets (40 mg total) by mouth daily with breakfast. (Patient not taking: Reported on 03/18/2023) 14 tablet 0   sucralfate (CARAFATE) 1 GM/10ML suspension Take 10 mLs (1 g total) by mouth 2 (two) times daily before a meal. (Patient not taking: Reported on 03/18/2023) 414 mL 0   No current facility-administered medications on file prior to visit.    No Known Allergies  Social History   Socioeconomic History   Marital status: Married    Spouse name: Not on file   Number of children: Not on file   Years of education: Not on file   Highest education level: Not on file  Occupational History   Not on file  Tobacco Use   Smoking status: Never    Passive exposure: Never   Smokeless tobacco: Never  Vaping Use   Vaping Use: Never used  Substance and Sexual Activity   Alcohol use: No   Drug use: No   Sexual activity: Yes  Other Topics Concern   Not on file  Social History Narrative   Not on file   Social Determinants of Health   Financial Resource Strain: Not on file  Food Insecurity:  Not on file  Transportation Needs: Not on file  Physical Activity: Not on file  Stress: Not on file  Social Connections: Not on file  Intimate Partner Violence: Not on file    Family History  Family history unknown: Yes    Past Surgical History:  Procedure Laterality Date   TONSILLECTOMY      ROS: Review of Systems Negative except as stated above  PHYSICAL EXAM: BP (!) 139/103 (BP Location: Left Arm, Patient Position: Sitting, Cuff Size: Large)   Pulse (!) 120   Temp 100.3 F (37.9 C) (Oral)   Ht 5\' 10"  (1.778 m)   Wt (!) 345 lb 12.8 oz (156.9 kg)   SpO2 96%   BMI 49.62 kg/m   Physical Exam HENT:     Head: Normocephalic and atraumatic.     Nose: Nose normal.     Mouth/Throat:     Mouth: Mucous membranes are moist.     Pharynx: Oropharynx is clear.  Eyes:     Extraocular  Movements: Extraocular movements intact.     Conjunctiva/sclera: Conjunctivae normal.     Pupils: Pupils are equal, round, and reactive to light.  Cardiovascular:     Rate and Rhythm: Tachycardia present.     Pulses: Normal pulses.     Heart sounds: Normal heart sounds.  Pulmonary:     Effort: Pulmonary effort is normal.     Breath sounds: Normal breath sounds.  Musculoskeletal:     Cervical back: Normal range of motion and neck supple.  Neurological:     General: No focal deficit present.     Mental Status: He is alert and oriented to person, place, and time.  Psychiatric:        Mood and Affect: Mood normal.        Behavior: Behavior normal.     ASSESSMENT AND PLAN: 1. Weakness - Resolved.   2. Fever, unspecified fever cause - Trial over-the-counter antipyretic as needed.   3. Primary hypertension 4. Tachycardia - Blood pressure not at goal during today's visit. Patient asymptomatic without chest pressure, chest pain, palpitations, shortness of breath, worst headache of life, and any additional red flag symptoms. - Intolerant to Valsartan due to made patient feel unwell.  -  Resume Hydrochlorothiazide as prescribed.  - Resume Amlodipine as prescribed.  - Counseled on blood pressure goal of less than 130/80, low-sodium, DASH diet, medication compliance, and 150 minutes of moderate intensity exercise per week as tolerated. Counseled on medication adherence and adverse effects. - Referral to Cardiology for further evaluation/management. During the interim follow-up with primary provider in 2 weeks or sooner if needed until established with referral.  - hydrochlorothiazide (HYDRODIURIL) 12.5 MG tablet; Take 1 tablet (12.5 mg total) by mouth daily.  Dispense: 90 tablet; Refill: 0 - amLODipine (NORVASC) 5 MG tablet; Take 1 tablet (5 mg total) by mouth daily.  Dispense: 90 tablet; Refill: 0 - Ambulatory referral to Cardiology  5. Throat irritation - Continue present management.  - Referral to ENT for further evaluation/management. During the interim follow-up with primary provider as scheduled until established with referral.  - Ambulatory referral to ENT  6. Gastroesophageal reflux disease, unspecified whether esophagitis present - Continue present management.  - Referral to Gastroenterology for further evaluation/management. During the interim follow-up with primary provider as scheduled until established with referral.  - Ambulatory referral to Gastroenterology   Patient was given the opportunity to ask questions.  Patient verbalized understanding of the plan and was able to repeat key elements of the plan. Patient was given clear instructions to go to Emergency Department or return to medical center if symptoms don't improve, worsen, or new problems develop.The patient verbalized understanding.   Orders Placed This Encounter  Procedures   Ambulatory referral to Cardiology   Ambulatory referral to ENT   Ambulatory referral to Gastroenterology     Requested Prescriptions   Signed Prescriptions Disp Refills   hydrochlorothiazide (HYDRODIURIL) 12.5 MG tablet 90  tablet 0    Sig: Take 1 tablet (12.5 mg total) by mouth daily.   amLODipine (NORVASC) 5 MG tablet 90 tablet 0    Sig: Take 1 tablet (5 mg total) by mouth daily.    Return in about 2 weeks (around 04/01/2023) for Follow-Up or next available blood pressure check .  Rema Fendt, NP

## 2023-03-26 ENCOUNTER — Encounter: Payer: Self-pay | Admitting: Gastroenterology

## 2023-04-01 ENCOUNTER — Ambulatory Visit: Payer: BC Managed Care – PPO | Admitting: Family

## 2023-06-10 ENCOUNTER — Ambulatory Visit: Payer: BC Managed Care – PPO | Admitting: Gastroenterology

## 2023-06-11 ENCOUNTER — Encounter: Payer: Self-pay | Admitting: Cardiovascular Disease

## 2023-06-11 NOTE — Progress Notes (Signed)
No Show This encounter was created in error - please disregard. 

## 2023-06-12 ENCOUNTER — Other Ambulatory Visit: Payer: Self-pay | Admitting: Family

## 2023-06-12 ENCOUNTER — Ambulatory Visit: Payer: BC Managed Care – PPO | Attending: Cardiovascular Disease | Admitting: Cardiovascular Disease

## 2023-06-12 DIAGNOSIS — I10 Essential (primary) hypertension: Secondary | ICD-10-CM

## 2023-06-15 MED ORDER — HYDROCHLOROTHIAZIDE 12.5 MG PO TABS
12.5000 mg | ORAL_TABLET | Freq: Every day | ORAL | 0 refills | Status: AC
Start: 2023-06-15 — End: ?

## 2023-06-15 MED ORDER — AMLODIPINE BESYLATE 5 MG PO TABS
5.0000 mg | ORAL_TABLET | Freq: Every day | ORAL | 0 refills | Status: AC
Start: 2023-06-15 — End: 2024-03-25

## 2023-06-15 NOTE — Telephone Encounter (Signed)
Complete

## 2023-07-03 ENCOUNTER — Other Ambulatory Visit: Payer: Self-pay | Admitting: Family

## 2023-07-03 DIAGNOSIS — I10 Essential (primary) hypertension: Secondary | ICD-10-CM

## 2023-09-17 ENCOUNTER — Ambulatory Visit: Payer: Medicaid Other | Admitting: Gastroenterology

## 2023-11-04 ENCOUNTER — Telehealth: Payer: Medicaid Other | Admitting: Physician Assistant

## 2023-11-04 ENCOUNTER — Telehealth: Payer: Medicaid Other

## 2023-11-04 DIAGNOSIS — R6889 Other general symptoms and signs: Secondary | ICD-10-CM

## 2023-11-04 NOTE — Progress Notes (Signed)
 I have spent 5 minutes in review of e-visit questionnaire, review and updating patient chart, medical decision making and response to patient.   Piedad Climes, PA-C

## 2023-11-04 NOTE — Progress Notes (Signed)
 E visit for Flu like symptoms   We are sorry that you are not feeling well.  Here is how we plan to help! Based on what you have shared with me it looks like you may have a respiratory virus that may be influenza.  You are unfortunately out of the time frame for an antiviral medication. As such, please follow the home measures recommended below.  Please keep well-hydrated and try to get plenty of rest. If you have a humidifier, place it in the bedroom and run it at night. Start a saline nasal rinse for nasal congestion. You can consider use of a nasal steroid spray like Flonase or Nasacort OTC. You can alternate between Tylenol and Ibuprofen if needed for fever, body aches, headache and/or throat pain. Salt water-gargles and chloraseptic spray can be very beneficial for sore throat. Mucinex-DM for congestion or cough. Please take all prescribed medications as directed.  Remain out of work until [employer] for 24 hours without a fever-reducing medication, and you are feeling better.  You should mask until symptoms are resolved.  If anything worsens despite treatment, you need to be evaluated in-person. Please do not delay care.  ANYONE WHO HAS FLU SYMPTOMS SHOULD: Stay home. The flu is highly contagious and going out or to work exposes others! Be sure to drink plenty of fluids. Water is fine as well as fruit juices, sodas and electrolyte beverages. You may want to stay away from caffeine or alcohol. If you are nauseated, try taking small sips of liquids. How do you know if you are getting enough fluid? Your urine should be a pale yellow or almost colorless. Get rest. Taking a steamy shower or using a humidifier may help nasal congestion and ease sore throat pain. Using a saline nasal spray works much the same way. Cough drops, hard candies and sore throat lozenges may ease your cough. Line up a caregiver. Have someone check on you regularly.   GET HELP RIGHT AWAY IF: You cannot keep down  liquids or your medications. You become short of breath Your fell like you are going to pass out or loose consciousness. Your symptoms persist after you have completed your treatment plan MAKE SURE YOU  Understand these instructions. Will watch your condition. Will get help right away if you are not doing well or get worse.  Your e-visit answers were reviewed by a board certified advanced clinical practitioner to complete your personal care plan.  Depending on the condition, your plan could have included both over the counter or prescription medications.  If there is a problem please reply  once you have received a response from your provider.  Your safety is important to [place].  If you have drug allergies check your prescription carefully.    You can use MyChart to ask questions about today's visit, request a non-urgent call back, or ask for a work or school excuse for 24 hours related to this e-Visit. If it has been greater than 24 hours you will need to follow up with your provider, or enter a new e-Visit to address those concerns.  You will get an e-mail in the next two days asking about your experience.  I hope that your e-visit has been valuable and will speed your recovery. Thank you for using e-visits.

## 2023-11-04 NOTE — Progress Notes (Signed)
 Message sent to patient requesting further input regarding current symptoms. Awaiting patient response.

## 2023-11-17 ENCOUNTER — Ambulatory Visit

## 2023-12-17 ENCOUNTER — Ambulatory Visit: Admitting: Gastroenterology

## 2023-12-17 ENCOUNTER — Encounter: Payer: Self-pay | Admitting: Gastroenterology

## 2023-12-17 VITALS — HR 89 | Ht 70.0 in | Wt 371.0 lb

## 2023-12-17 DIAGNOSIS — R1110 Vomiting, unspecified: Secondary | ICD-10-CM

## 2023-12-17 DIAGNOSIS — R09A2 Foreign body sensation, throat: Secondary | ICD-10-CM

## 2023-12-17 DIAGNOSIS — R110 Nausea: Secondary | ICD-10-CM | POA: Diagnosis not present

## 2023-12-17 DIAGNOSIS — K219 Gastro-esophageal reflux disease without esophagitis: Secondary | ICD-10-CM | POA: Diagnosis not present

## 2023-12-17 DIAGNOSIS — R634 Abnormal weight loss: Secondary | ICD-10-CM

## 2023-12-17 MED ORDER — ESOMEPRAZOLE MAGNESIUM 40 MG PO CPDR: 40.0000 mg | DELAYED_RELEASE_CAPSULE | Freq: Every day | ORAL | 3 refills | Status: AC

## 2023-12-17 NOTE — Progress Notes (Signed)
 Agree with assessment and plan as outlined.

## 2023-12-17 NOTE — Progress Notes (Signed)
 Chief Complaint: GERD Primary GI MD: Gentry Fitz  HPI: Discussed the use of AI scribe software for clinical note transcription with the patient, who gave verbal consent to proceed.  History of Present Illness Zachary Sharp is a 33 year old male with GERD, obesity, hypertension who presents with persistent symptoms despite treatment.  For the past year, he has experienced persistent symptoms initially resembling allergies, including post-nasal drip and persistent coughing, particularly severe in the mornings. He describes a sensation of something stuck in his esophagus, leading to coughing fits that sometimes result in vomiting, though without nausea or pain.  Certain foods unpredictably trigger his symptoms, with episodes of regurgitation occurring after eating. This issue became more noticeable around June, with symptoms worsening by July, leading to significant weight loss of 40-50 pounds due to decreased food intake and dehydration.  He has been using over-the-counter Nexium 20 mg and Pepcid 20 mg, which initially helped, but he began experiencing flare-ups last month. Stress and certain foods exacerbate his condition, and he has been cautious with his diet, avoiding spicy and acidic foods.  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 30 capsule 3   hydrochlorothiazide (HYDRODIURIL) 12.5 MG tablet Take 1 tablet (12.5 mg total) by mouth daily. 90 tablet 0   amLODipine (NORVASC) 5 MG tablet Take 1 tablet (5 mg total) by  mouth daily. 90 tablet 0   No current facility-administered medications for this visit.    Allergies as of 12/17/2023   (No Known Allergies)    Family History  Family history unknown: Yes    Social History   Socioeconomic History   Marital status: Married    Spouse name: Not on file   Number of children: Not on file   Years of education: Not on file   Highest education level: Not on file  Occupational History   Not on file  Tobacco Use   Smoking status: Never    Passive exposure: Never   Smokeless tobacco: Never  Vaping Use   Vaping status: Never Used  Substance and Sexual Activity   Alcohol use: No   Drug use: No   Sexual activity: Yes  Other Topics Concern   Not on file  Social History Narrative   Not on file   Social Drivers of Health   Financial Resource Strain: Not on file  Food Insecurity: Not on file  Transportation Needs: Not on file  Physical Activity: Not on file  Stress: Not on file  Social Connections: Not on file  Intimate Partner Violence: Not on file    Review of Systems:    Constitutional: No weight loss, fever, chills, weakness or fatigue HEENT: Eyes: No change in vision               Ears, Nose, Throat:  No change in hearing or congestion Skin: No rash or itching Cardiovascular: No chest pain, chest pressure or palpitations   Respiratory: No SOB or cough Gastrointestinal: See HPI and otherwise negative  Genitourinary: No dysuria or change in urinary frequency Neurological: No headache, dizziness or syncope Musculoskeletal: No new muscle or joint pain Hematologic: No bleeding or bruising Psychiatric: No history of depression or anxiety    Physical Exam:  Vital signs: Pulse 89   Ht 5\' 10"  (1.778 m)   Wt (!) 371 lb (168.3 kg)   BMI 53.23 kg/m   Constitutional: NAD, , alert and cooperative.  Morbid obesity Head:  Normocephalic and atraumatic. Eyes:   PEERL, EOMI. No icterus. Conjunctiva pink. Respiratory: Respirations even and  unlabored. Lungs clear to auscultation bilaterally.   No wheezes, crackles, or rhonchi.  Cardiovascular:  Regular rate and rhythm. No peripheral edema, cyanosis or pallor.  Gastrointestinal:  Soft, nondistended, nontender. No rebound or guarding. Normal bowel sounds. No appreciable masses or hepatomegaly. Rectal:  Not performed.  Msk:  Symmetrical without gross deformities. Without edema, no deformity or joint abnormality.  Neurologic:  Alert and  oriented x4;  grossly normal neurologically.  Skin:   Dry and intact without significant lesions or rashes. Psychiatric: Oriented to person, place and time. Demonstrates good judgement and reason without abnormal affect or behaviors.   RELEVANT LABS AND IMAGING: CBC    Component Value Date/Time   WBC 4.3 03/12/2023 0450   RBC 5.31 03/12/2023 0450   HGB 14.2 03/12/2023 0450   HCT 43.6 03/12/2023 0450   PLT 300 03/12/2023 0450   MCV 82.1 03/12/2023 0450   MCH 26.7 03/12/2023 0450   MCHC 32.6 03/12/2023 0450   RDW 12.3 03/12/2023 0450   LYMPHSABS 1.3 03/12/2023 0450   MONOABS 0.5 03/12/2023 0450   EOSABS 0.0 03/12/2023 0450   BASOSABS 0.0 03/12/2023 0450    CMP     Component Value Date/Time   NA 133 (L) 03/12/2023 0450   NA 139 11/22/2021 1504   K 3.9 03/12/2023 0450   CL 103 03/12/2023 0450   CO2 20 (L) 03/12/2023 0450   GLUCOSE 114 (H) 03/12/2023 0450   BUN 9 03/12/2023 0450   BUN 8 11/22/2021 1504   CREATININE 0.82 03/12/2023 0450   CALCIUM 8.8 (L) 03/12/2023 0450   PROT 7.1 03/12/2023 0450   ALBUMIN 3.7 03/12/2023 0450   AST 35 03/12/2023 0450   ALT 44 03/12/2023 0450   ALKPHOS 43 03/12/2023 0450   BILITOT 1.0 03/12/2023 0450   GFRNONAA >60 03/12/2023 0450     Assessment/Plan:   Assessment & Plan  GERD Nausea Regurgitation Globus sensation Chronic dyspeptic symptoms over the last year characterized by GERD, nausea, regurgitation, and globus sensation initially improved on Nexium 20 Mg once daily and Pepcid 20 Mg  once daily now having breakthrough symptoms.  Likely multifactorial with his history of obesity and currently undergoing extrinsic stressors. - Increase Nexium to 40 mg once daily. - Continue Pepcid 20 mg as needed. - Perform H. pylori stool study. - Provide dietary and lifestyle modification handouts. - Consider endoscopy if symptoms persist despite medication adjustment.  Endoscopy would have to be performed at the hospital due to BMI over 50. - Follow-up 8 weeks  Weight Loss Significant weight loss likely secondary to decreased oral intake.  Currently gaining the weight back. - Monitor weight and nutritional status. - Encourage adequate hydration and balanced diet. - If continued unintentional weight loss would recommend endoscopic evaluation.  Assigned to Dr. Adela Lank today  Wt Readings from Last 3 Encounters:  12/17/23 (!) 371 lb (168.3 kg)  03/18/23 (!) 345 lb 12.8 oz (156.9 kg)  03/12/23 (!) 360 lb (163.3 kg)  [facility] 12/17/2023, 3:46 PM  Cc: Rema Fendt, NP

## 2023-12-17 NOTE — Patient Instructions (Addendum)
 We have sent the following medications to your pharmacy for you to pick up at your convenience: Nexium 40mg  daily   Your provider has ordered "Diathe[employer]" stool testing for you. You have received a kit from our office today containing all necessary supplies to complete this test. Please carefully read the stool collection instructions provided in the kit before opening the accompanying materials. In addition, be sure there is a label providing your full name and date of birth on the "puritan opti-swab" tube that is supplied in the kit (if you do not see a label with this information on your test tube, please make [place] aware before test collection!). After completing the test, you should secure the purtian tube into the specimen biohazard bag. The [facility] E-Req sheet (including date and time of specimen collection) should be placed into the outside pocket of the specimen biohazard bag and returned to the [place] lab (basement floor of [facility]) within 3 days of collection. Please make sure to give the specimen to a staff member at the lab. DO NOT leave the specimen on the counter.   If the specimen date and time (can be found in the upper right boxed portion of the sheet) are not filled out on the E-Req sheet, the test will NOT be performed.   _______________________________________________________  If your blood pressure at your visit was 140/90 or greater, please contact your primary care physician to follow up on this.  _______________________________________________________  If you are age 31 or older, your body mass index should be between 23-30. Your Body mass index is 53.23 kg/m. If this is out of the aforementioned range listed, please consider follow up with your Primary Care Provider.  If you are age 39 or younger, your body mass index should be between 19-25. Your Body mass index is 53.23 kg/m. If this is out of the aformentioned range listed, please  consider follow up with your Primary Care Provider.   ________________________________________________________  The [facility] providers would like to encourage you to use [facility] to communicate with providers for non-urgent requests or questions.  Due to long hold times on the telephone, sending your provider a message by [facility] may be a faster and more efficient way to get a response.  Please allow 48 business hours for a response.  Please remember that this is for non-urgent requests.  _______________________________________________________ Thank you for trusting me with your gastrointestinal care!   Boone Master, PA

## 2024-02-11 ENCOUNTER — Ambulatory Visit: Admitting: Gastroenterology

## 2024-02-18 IMAGING — DX DG CHEST 2V
2 series · 2 of 2 positions shown · non-contrast
Comparison: None.

CLINICAL DATA: 30-year-old male with bronchitis

EXAM:
CHEST - 2 VIEW

[chest pa]
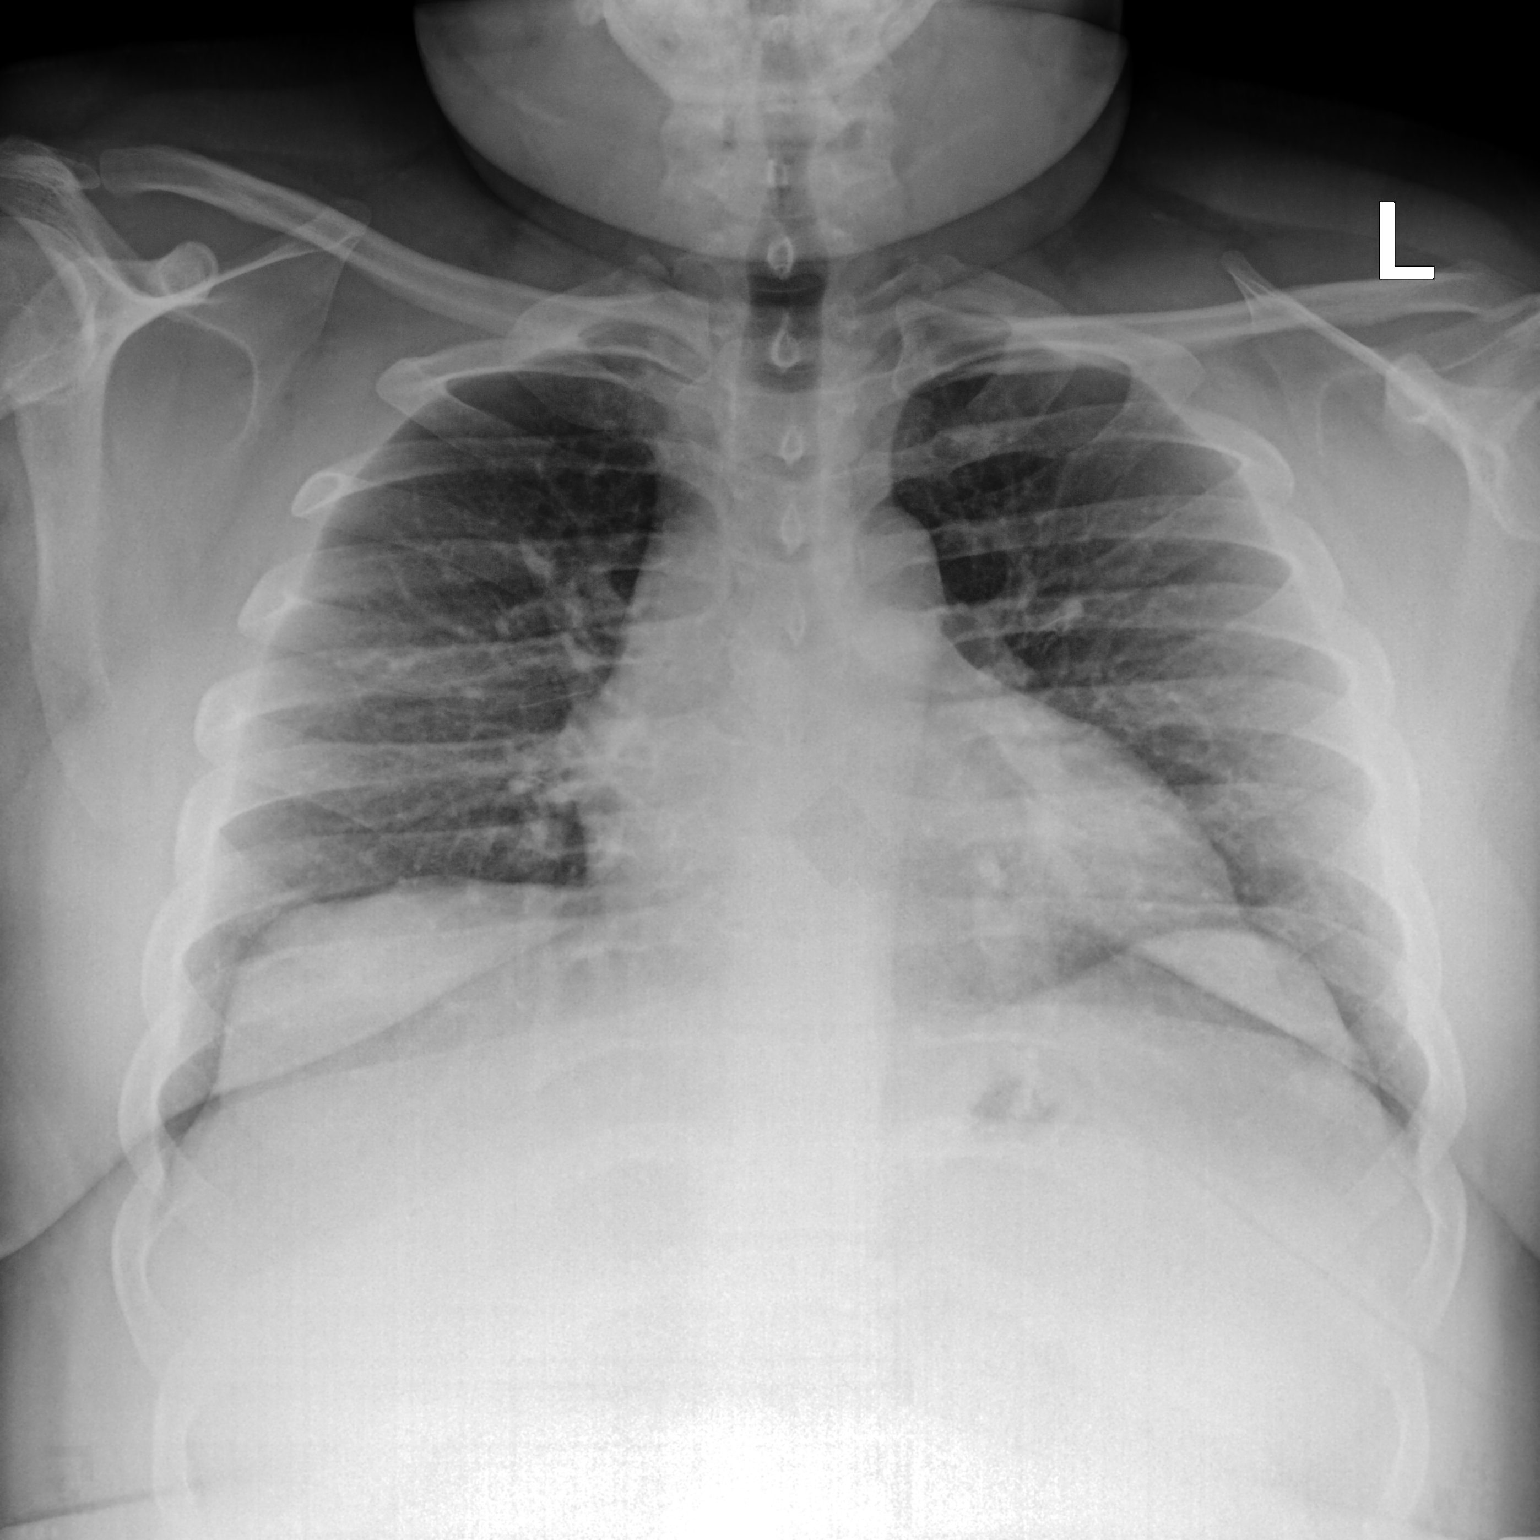

[chest lat]
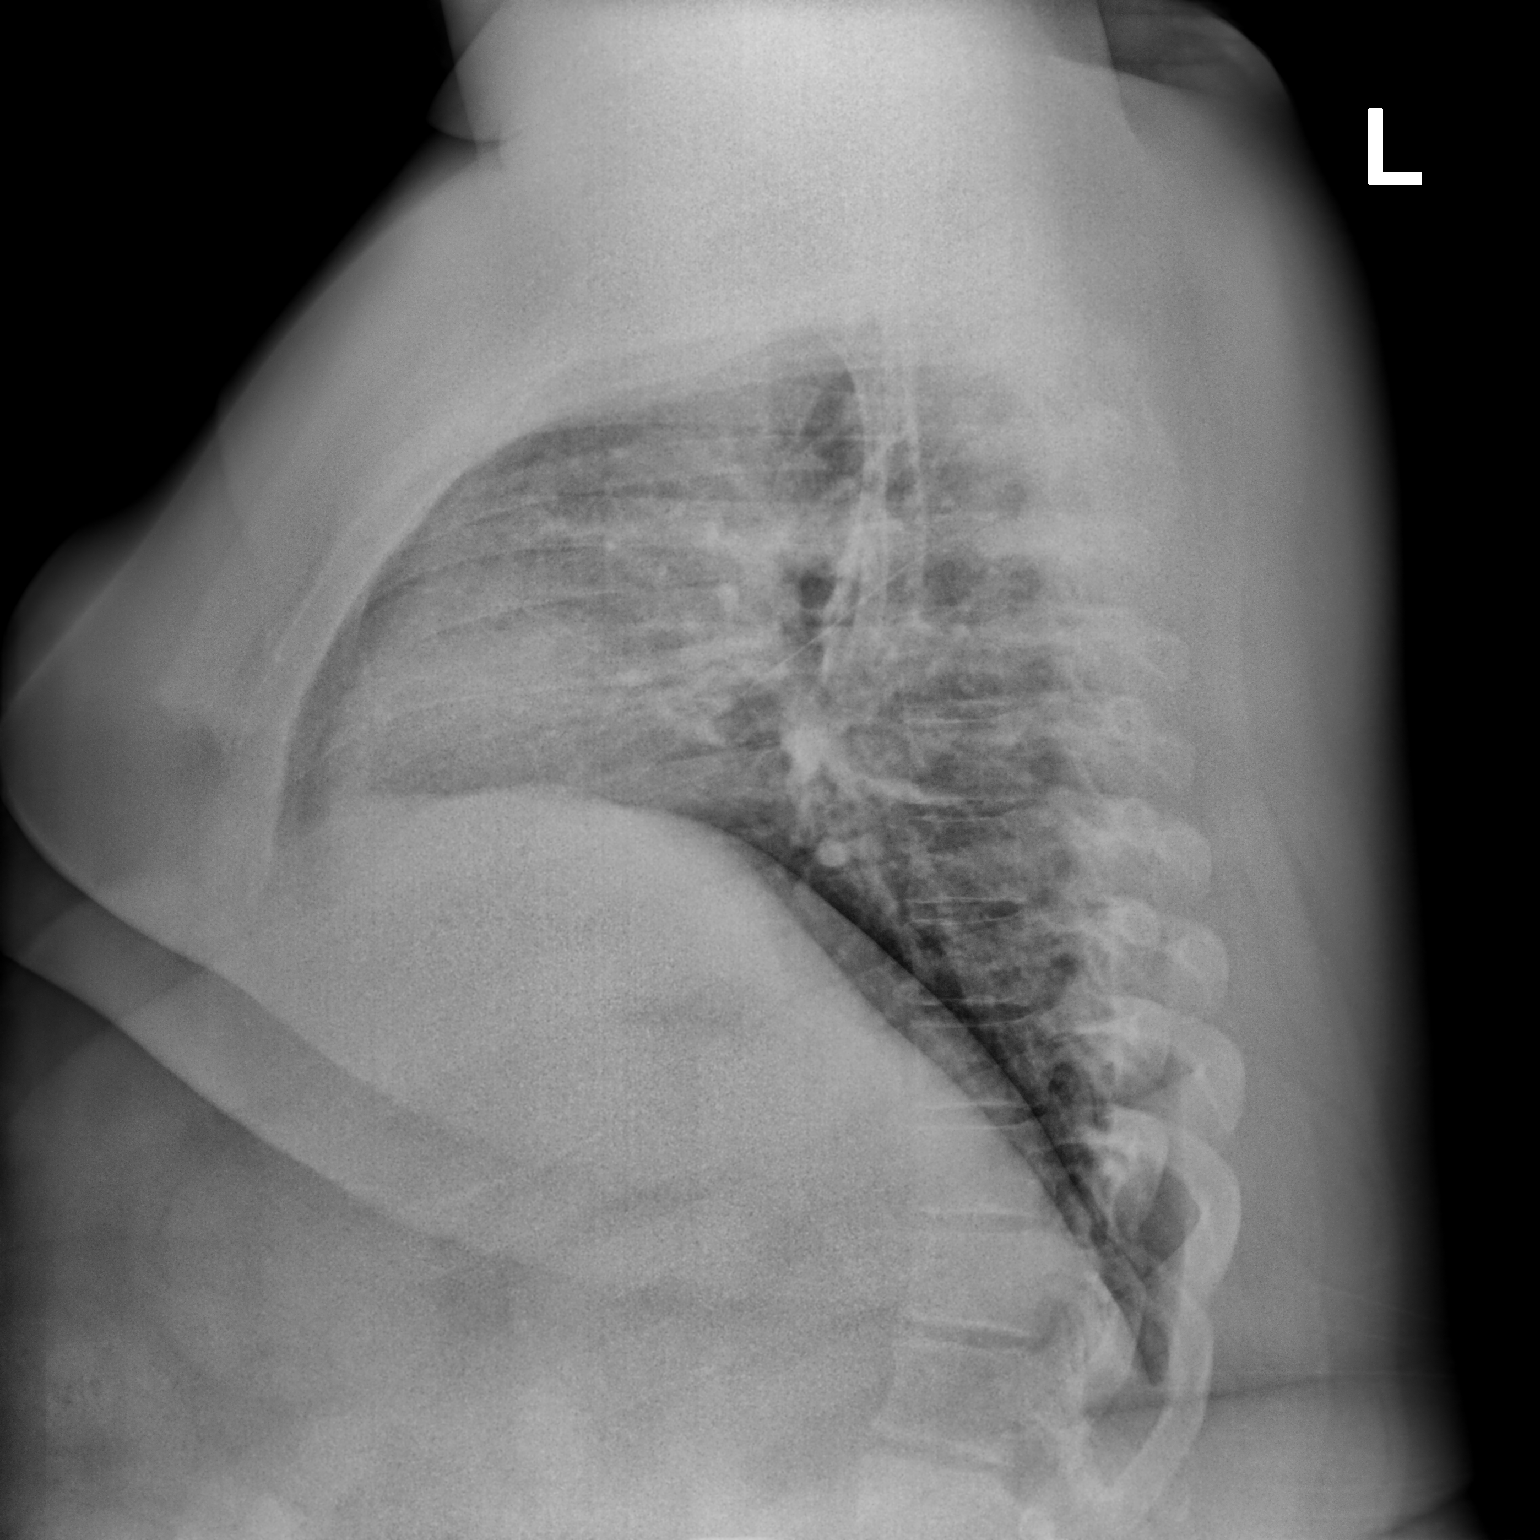

[2 of 2 positions shown; findings below may reference images not displayed]

FINDINGS: Cardiomediastinal silhouette within normal limits.

No interlobular septal thickening.

Low lung volumes with apical lordotic positioning. No pneumothorax
or pleural effusion. Linear opacity a at the bases on the lateral
view likely atelectasis.

No displaced fracture
IMPRESSION: Low lung volumes without acute cardiopulmonary disease

## 2024-03-18 ENCOUNTER — Ambulatory Visit: Admitting: Physician Assistant

## 2024-03-24 NOTE — Progress Notes (Unsigned)
 Ellouise Console, PA-C [address] [place], [place]  [address] Phone: [phone]   Primary Care Physician: Lorren Greig PARAS, NP  Primary Gastroenterologist:  Ellouise Console, PA-C / Elspeth Naval, MD   Chief Complaint: Follow-up acid reflux.       HPI:   Zachary Sharp is a 33 y.o. male returns for 27-month follow-up of acid reflux.  Last saw Con Blower, PA-C 12/17/2023.  Symptoms included globus sensation and cough with vomiting, but no nausea.  Regurgitation after eating.  OTC Nexium  20 Mg and Pepcid  20 Mg daily initially helped, but did not control symptoms.  He tried avoiding GERD trigger foods and drinks.  3 months ago Nexium  was increased to 40 Mg daily.  Continued Pepcid  20 Mg daily.  H. pylori stool test was ordered, but not completed.  Medical history significant for morbid obesity (BMI greater than 50), hypertension, asthma, allergies.  Current symptoms:  Current Outpatient Medications  Medication Sig Dispense Refill   amLODipine  (NORVASC ) 5 MG tablet Take 1 tablet (5 mg total) by mouth daily. 90 tablet 0   esomeprazole  (NEXIUM ) 40 MG capsule Take 1 capsule (40 mg total) by mouth daily at 12 noon. 30 capsule 3   hydrochlorothiazide  (HYDRODIURIL ) 12.5 MG tablet Take 1 tablet (12.5 mg total) by mouth daily. 90 tablet 0   No current facility-administered medications for this visit.    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    Physical Exam:  There were no vitals taken for this visit. No LMP for male patient.  General: Well-nourished, well-developed in no acute distress.  Lungs:  Clear to auscultation bilaterally. Non-labored. Heart: Regular rate and rhythm, no murmurs rubs or gallops.  Abdomen: Bowel sounds are normal; Abdomen is Soft; No hepatosplenomegaly, masses or hernias;  No Abdominal Tenderness; No guarding or rebound tenderness. Neuro: Alert and oriented x 3.  Grossly intact.  Psych: Alert and cooperative, normal mood and affect.   Imaging Studies: No results found.  Labs: CBC    Component Value Date/Time   WBC 4.3 03/12/2023 0450   RBC 5.31 03/12/2023 0450   HGB 14.2 03/12/2023 0450   HCT 43.6 03/12/2023 0450   PLT 300 03/12/2023 0450   MCV 82.1 03/12/2023 0450   MCH 26.7 03/12/2023 0450   MCHC 32.6 03/12/2023 0450   RDW 12.3 03/12/2023 0450   LYMPHSABS 1.3 03/12/2023 0450   MONOABS 0.5 03/12/2023 0450   EOSABS 0.0 03/12/2023 0450   BASOSABS 0.0 03/12/2023 0450    CMP     Component Value Date/Time   NA 133 (L) 03/12/2023 0450   NA 139 11/22/2021 1504   K 3.9 03/12/2023 0450   CL 103 03/12/2023 0450   CO2 20 (L) 03/12/2023 0450   GLUCOSE 114 (H) 03/12/2023 0450   BUN 9 03/12/2023 0450   BUN 8 11/22/2021 1504   CREATININE 0.82 03/12/2023 0450   CALCIUM 8.8 (L) 03/12/2023 0450   PROT 7.1 03/12/2023 0450   ALBUMIN 3.7 03/12/2023 0450   AST 35 03/12/2023 0450   ALT  44 03/12/2023 0450   ALKPHOS 43 03/12/2023 0450   BILITOT 1.0 03/12/2023 0450   GFRNONAA >60 03/12/2023 0450       Assessment and Plan:   Kiren Mcisaac is a 33 y.o. y/o male returns for follow-up of:  1.  Gastroesophageal reflux disease - Continue PPI, H2 RB:  - Continue Nexium  40 Mg daily,  - Increase Pepcid  40 Mg twice daily - Recommend Lifestyle Modifications to prevent Acid Reflux.  Rec. Avoid coffee, sodas, peppermint, garlic, onions, alcohol, citrus fruits, chocolate, tomatoes, fatty and spicey foods.  Avoid eating 2-3 hours before bedtime.    2.  Morbid obesity, BMI greater than 50 - Continue weight loss efforts   If still symptomatic consider EGD in  hospital. Do H. pylori test?   Ellouise Console, PA-C  Follow up ***

## 2024-03-25 ENCOUNTER — Encounter: Payer: Self-pay | Admitting: Physician Assistant

## 2024-03-25 ENCOUNTER — Ambulatory Visit: Admitting: Physician Assistant

## 2024-03-25 VITALS — BP 132/74 | HR 123 | Ht 70.0 in | Wt 385.0 lb

## 2024-03-25 DIAGNOSIS — K219 Gastro-esophageal reflux disease without esophagitis: Secondary | ICD-10-CM | POA: Diagnosis not present

## 2024-03-25 MED ORDER — FAMOTIDINE 20 MG PO TABS
20.0000 mg | ORAL_TABLET | Freq: Two times a day (BID) | ORAL | Status: AC
Start: 2024-03-25 — End: ?

## 2024-03-25 NOTE — Patient Instructions (Addendum)
 Continue Nexium  40mg  once daily.  Try to take it 30 minutes before a meal.  You may also take OTC Pepcid  (Famotidine ) 20mg  1 tablet twice daily if needed.  You may also take OTC Antiacid (TUMS, Maalox, Mylanta) if needed.  Recommend Lifestyle Modifications to prevent Acid Reflux.   Rec. Avoid coffee, sodas, peppermint, garlic, onions, alcohol, citrus fruits, chocolate, tomatoes, fatty and spicey foods.   Avoid eating 2-3 hours before bedtime.    Please follow up sooner if symptoms increase or worsen  Due to recent changes in healthcare laws, you may see the results of your imaging and laboratory studies on MyChart before your provider has had a chance to review them.  We understand that in some cases there may be results that are confusing or concerning to you. Not all laboratory results come back in the same time frame and the provider may be waiting for multiple results in order to interpret others.  Please give us  48 hours in order for your provider to thoroughly review all the results before contacting the office for clarification of your results.   Thank you for trusting me with your gastrointestinal care!   Ellouise Console, PA-C _______________________________________________________  If your blood pressure at your visit was 140/90 or greater, please contact your primary care physician to follow up on this.  _______________________________________________________  If you are age 36 or older, your body mass index should be between 23-30. Your Body mass index is 55.24 kg/m. If this is out of the aforementioned range listed, please consider follow up with your Primary Care Provider.  If you are age 50 or younger, your body mass index should be between 19-25. Your Body mass index is 55.24 kg/m. If this is out of the aformentioned range listed, please consider follow up with your Primary Care Provider.   ________________________________________________________  The [facility] providers  would like to encourage you to use MYCHART to communicate with providers for non-urgent requests or questions.  Due to long hold times on the telephone, sending your provider a message by [facility] may be a faster and more efficient way to get a response.  Please allow 48 business hours for a response.  Please remember that this is for non-urgent requests.  _______________________________________________________

## 2024-03-26 NOTE — Progress Notes (Signed)
 Agree with assessment and plan as outlined.
